# Patient Record
Sex: Female | Born: 1970
Health system: Southern US, Community
[De-identification: ages and names within clinical notes are randomized; demographics above are authoritative.]

## PROBLEM LIST (undated history)

## (undated) DIAGNOSIS — D649 Anemia, unspecified: Secondary | ICD-10-CM

## (undated) DIAGNOSIS — E079 Disorder of thyroid, unspecified: Secondary | ICD-10-CM

## (undated) HISTORY — DX: Disorder of thyroid, unspecified: E07.9

## (undated) HISTORY — DX: Anemia, unspecified: D64.9

---

## 2002-04-09 ENCOUNTER — Inpatient Hospital Stay (HOSPITAL_COMMUNITY): Admission: AD | Admit: 2002-04-09 | Discharge: 2002-04-10 | Payer: Self-pay | Admitting: Obstetrics and Gynecology

## 2004-10-17 ENCOUNTER — Encounter (INDEPENDENT_AMBULATORY_CARE_PROVIDER_SITE_OTHER): Payer: Self-pay | Admitting: *Deleted

## 2004-10-17 ENCOUNTER — Ambulatory Visit (HOSPITAL_COMMUNITY): Admission: AD | Admit: 2004-10-17 | Discharge: 2004-10-17 | Payer: Self-pay | Admitting: Obstetrics and Gynecology

## 2005-01-21 ENCOUNTER — Other Ambulatory Visit: Admission: RE | Admit: 2005-01-21 | Discharge: 2005-01-21 | Payer: Self-pay | Admitting: Obstetrics and Gynecology

## 2005-10-02 ENCOUNTER — Ambulatory Visit: Payer: Self-pay | Admitting: Infectious Diseases

## 2005-10-02 ENCOUNTER — Inpatient Hospital Stay (HOSPITAL_COMMUNITY): Admission: EM | Admit: 2005-10-02 | Discharge: 2005-10-07 | Payer: Self-pay | Admitting: Emergency Medicine

## 2005-12-16 ENCOUNTER — Encounter: Admission: RE | Admit: 2005-12-16 | Discharge: 2005-12-16 | Payer: Self-pay | Admitting: Family Medicine

## 2005-12-25 ENCOUNTER — Other Ambulatory Visit: Admission: RE | Admit: 2005-12-25 | Discharge: 2005-12-25 | Payer: Self-pay | Admitting: Family Medicine

## 2008-09-18 ENCOUNTER — Observation Stay (HOSPITAL_COMMUNITY): Admission: RE | Admit: 2008-09-18 | Discharge: 2008-09-18 | Payer: Self-pay | Admitting: Obstetrics and Gynecology

## 2008-09-30 ENCOUNTER — Inpatient Hospital Stay (HOSPITAL_COMMUNITY): Admission: AD | Admit: 2008-09-30 | Discharge: 2008-10-02 | Payer: Self-pay | Admitting: Obstetrics and Gynecology

## 2011-01-10 ENCOUNTER — Encounter: Payer: Self-pay | Admitting: Obstetrics and Gynecology

## 2011-05-05 NOTE — H&P (Signed)
Allison [name], Allison [name]                     ACCOUNT NO.:  [id number]   MEDICAL RECORD NO.:  [id number]          PATIENT TYPE:  INP   LOCATION:  9165                          FACILITY:  [facility]   PHYSICIAN:  Janine Limbo, M.D.DATE OF BIRTH:  15-Aug-1971   DATE OF ADMISSION:  09/30/2008  DATE OF DISCHARGE:                              HISTORY & PHYSICAL   HISTORY OF PRESENT ILLNESS:  Ms. Villalon is a 40 year old female, gravida 3,  para 1-0-1-1, who presents at 38 weeks and 4 days.  Her EDC is October 10, 2008.  The patient has been followed at the [facility] and Gynecology Division of [facility].  This pregnancy has been complicated by the fact that she is greater than  51 years of age.  She had a normal first trimester screen and a normal  alpha-fetoprotein screen.  She declined amniocentesis.  Her pregnancy  has been largely uncomplicated.  The patient was noted to be in a  transverse presentation.  External version was successful, and the  patient is now vertex.   OBSTETRICAL HISTORY:  In 2003, the patient had a vaginal delivery of a 6  pound 2 ounce female infant at [redacted] weeks gestation.  In 2005, the patient  had a first-trimester miscarriage.  A dilatation and curettage followed.   DRUG ALLERGIES:  No known drug allergies, Betadine allergies, or latex  allergies.   SOCIAL HISTORY:  The patient denies cigarette use, alcohol use, and  recreational drug use.   REVIEW OF SYSTEMS:  Normal pregnancy complaints.   FAMILY HISTORY:  Noncontributory.   PHYSICAL EXAMINATION:  VITAL SIGNS:  The patient's weight is 142 pounds.  The patient is afebrile.  Her vital signs are stable.  HEENT:  Within normal limits except for the patient is uncomfortable  when she has her contractions.  CHEST:  Clear.  HEART:  Regular rate and rhythm.  ABDOMEN:  Gravid.  EXTREMITIES:  Grossly normal.  NEUROLOGIC:  Grossly normal.  PELVIC:  The cervix is 5 cm dilated, 75%  effaced, and -1 in station.   LABORATORY VALUES:  Blood type is AB positive.  Antibody screen is  negative.  VDRL nonreactive.  HBsAg negative.  HIV nonreactive.  Cystic  fibrosis negative.  Third trimester beta-strep is negative.  Glucola  screen negative.  First-trimester screen within normal limits.  Alpha-  fetoprotein screen within normal limits.  Rubella is equivocal.   HOSPITAL COURSE:  The patient was admitted to Labor and Delivery.  Her  membranes were ruptured, and the amniotic fluid was noted to be clear.  The patient was noted to have contractions about every 8 minutes.  They  were mild-to-moderate in intensity prior to rupture of membranes.  The  patient reports that she had irregular contractions at home.  She denies  ruptured membranes and vaginal bleeding.   Her laboratory values at admission include a hemoglobin of 12.7,  hematocrit of 38.2%, white blood cell count of 8900, and platelet count  of 139,000.  The patient was noted to have  thrombocytopenia during the  pregnancy, but her values have always been acceptable.   ASSESSMENT:  1. A 38-1/2 weeks' gestation.  2. Early labor.  3. Age greater than 35 (normal first-trimester screen, the patient      declined amniocentesis).  4. Thrombocytopenia with a stable platelet count at admission.   PLAN:  We will plan on a vaginal delivery.      Janine Limbo, M.D.  Electronically Signed     AVS/MEDQ  D:  09/30/2008  T:  09/30/2008  Job:  [id number]

## 2011-05-08 NOTE — Discharge Summary (Signed)
Allison [name], [name]                     ACCOUNT NO.:  [id number]   MEDICAL RECORD NO.:  [id number]          PATIENT TYPE:  INP   LOCATION:  1606                         FACILITY:  [facility]   PHYSICIAN:  Theone Stanley, MD   DATE OF BIRTH:  09-19-1971   DATE OF ADMISSION:  10/02/2005  DATE OF DISCHARGE:  10/07/2005                                 DISCHARGE SUMMARY   ADMISSION DIAGNOSES:  1.  Nausea and vomiting.  2.  Back pain.   DISCHARGE DIAGNOSES:  1.  Pyelonephritis.  2.  Mild splenomegaly.  3.  Iron-deficiency anemia.   CONSULTATIONS:  Dr. Ninetta Lights with infectious disease.   PROCEDURES/DIAGNOSTIC TESTS:  Patient had an ultrasound of the kidneys  performed on the 16th which showed unremarkable renal ultrasound without  evidence of hydronephrosis or abscess.  Mild splenomegaly.   PERTINENT LABS:  Last white count was 4, hemoglobin 12, hematocrit 36,  platelets 182.  Sodium 140, potassium 3.8, chloride 109, CO2 27, glucose  115, BUN 2, creatinine 0.8.  Iron studies:  Iron less than 10.  UIBC 240.  Ferritin 57.  HCG quantitative urine was negative.  EBV IgG was positive;  however, EBV IgM was negative.  Monospot was negative.  No organism was  grown out of her cultures, including blood and urine.   HOSPITAL COURSE:  [name] is a very pleasant, non-English speaking [place]  female who presents to urgent care on the 12th with fever, chills, malaise,  left flank pain.  It was noted that she had a urinary tract infection.  She  was given oral Levaquin; however, she was unable to keep any of the  antibiotics down.  Because of nausea and vomiting, she presented to the ER  with the same symptoms and a temperature of 102.9.  Patient was started on  Zosyn on her admission.  The patient's pain improved on a daily basis;  however, she continued to spike fevers.  Because of this, infectious disease  was contacted, especially after she had mild splenomegaly.  Workup included  EBV, which was  negative for acute infection.  Mono was negative.  AFB was  negative.  Her fever had resolved by the time of her discharge.  Because of  this, she was discharged on oral antibiotics on the 18th in stable  condition.   DISCHARGE MEDICATIONS:  1.  Augmentin 875 mg 1 p.o. b.i.d. for an additional 7 days.  2.  Lortab 5/500 mg 1 p.o. q.6h. p.r.n.  3.  Iron sulfate 325 mg 1 p.o. b.i.d.  4.  Folic acid 1 mg 1 p.o. daily.  5.  She was instructed to get vitamin C 250 mg 1 p.o. daily over-the-counter      medication.  6.  Instructed to continue on her birth control medication.   FOLLOW UP:  Patient does not have a primary care physician.  I was able to  get her an appointment on October 25 with Dr. Blossom Hoops at [facility]      Theone Stanley, MD  Electronically Signed  AEJ/MEDQ  D:  10/07/2005  T:  10/07/2005  Job:  [id number]   cc:   Leanne Chang, M.D.  Fax: [phone]

## 2011-05-08 NOTE — H&P (Signed)
[facility]  Patient:    Allison [name], Allison [name] Visit Number: [id number] MRN: [id number]          Service Type: OBS Location: [facility] Attending Physician:  Esmeralda Arthur Dictated by:   Saverio Danker, C.N.M. Admit Date:  04/09/2002                           History and Physical  HISTORY OF PRESENT ILLNESS:   Allison [name] is a 40 year old married Asian female, gravida 1, para 0, at [redacted] weeks gestation by ultrasound, who presents complaining of uterine contractions every 2-3 minutes since about 10 p.m.  She now notes a leak of fluid and to feel "that the baby is coming."  She denies any nausea, vomiting, headaches or visual disturbances.  Her pregnancy has been followed at [facility] by the certified nurse midwife service and has been essentially uncomplicated except for positive group B strep and a significant language barrier.  She also has a history of decreased platelets at her first visit in our office.  OBSTETRICAL/GYNECOLOGICAL HISTORY:                      She is a primigravida with an LMP of August 06, 2001, giving her an [facility] of May 25, that was corrected to May 23, by ultrasound.  She has no other GYN history.  ALLERGIES:                    She denies any drug allergies.  PAST MEDICAL HISTORY:         She is unsure of whether she has had any childhood diseases.  She denies any medical problems whatsoever with herself or her family.  GENETIC HISTORY:              She denies any genetic history.  SOCIAL HISTORY:               She is married to [employer].  They are of the Catholic faith.  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 They deny any illicit drug use, alcohol or smoking with this pregnancy.  PRENATAL LABORATORIES:        Her blood type is AB positive.  Her antibody screen is negative.  Syphilis is nonreactive.  Rubella is immune.  Hepatitis B surface antigen is negative.  GC and Chlamydia are both negative.  Pap  was within normal limits.  Her one-hour Glucola was within normal limits.  Her group B strep was positive.  PHYSICAL EXAMINATION:  VITAL SIGNS:                  Her vital signs are stable, she is afebrile.  HEENT:                        Grossly within normal limits.  HEART:                        Regular rhythm and rate.  CHEST:                        Clear.  BREASTS:                      Soft and nontender.  ABDOMEN:  Gravid with uterine contractions every 2-3 minutes.  Her fetal heart rate is reactive and reassuring.  GENITAL:                      Her pelvic exam is 5 cm, 100%, vertex -1 to 0 with spontaneous rupture of the membranes with thin meconium-stained fluid noted.  EXTREMITIES:                  Within normal limits.  ASSESSMENT:                   1. Intrauterine pregnancy at term.                               2. Active labor.                               3. Thin meconium-stained fluid.                               4. Positive group B Streptococcus.  PLAN:                         To admit to labor and delivery, to follow routine CNM orders, to notify the M.D. on call of patients admission, to give her penicillin for group B strep, and to prepare for meconium-stained fluid at delivery. Dictated by:   Vance Gather Duplantis, C.N.M. Attending Physician:  Esmeralda Arthur DD:  04/09/02 TD:  04/09/02 Job: [id number]

## 2011-05-08 NOTE — H&P (Signed)
NAMEKINDRED, HEYING                     ACCOUNT NO.:  [id number]   MEDICAL RECORD NO.:  [id number]          PATIENT TYPE:  EMS   LOCATION:  ED                           FACILITY:  [facility]   PHYSICIAN:  Sherin Quarry, MD      DATE OF BIRTH:  09-05-1971   DATE OF ADMISSION:  10/02/2005  DATE OF DISCHARGE:                                HISTORY & PHYSICAL   Allison [name] is a 40 year old lady who does not speak [place]. History is  obtained from her father who is fairly fluent in [place]. Allison [name] is  generally in excellent health. She does not normally take any medications  except for birth control injections. She has a 69-year-old daughter who is in  good health and had a normal vaginal delivery. Yesterday the patient had an  episode of fever, chills and malaise. This resolved but then today while she  was a work she had another episode of fever, chills, malaise and left flank  pain. She says that she has had intermittent dysuria ever since this illness  began. She was taken to an Urgent Care where a urinalysis was obtained and  pyuria was identified. A urinary tract infection was diagnosed and she was  given oral Levaquin. Unfortunately she has been unable to take the Levaquin  because of nausea and vomiting. Her father became concerned about and  brought her into the [facility] emergency room tonight. On arrival, her  temperature is 102.9, blood pressure 113/54, pulse was 112, O2 saturation  99%. Laboratory studies obtained included a negative urine pregnancy test.  The urinalysis revealed large leukocyte esterase and evidence of gross  pyuria. The white count was 11,000, potassium was 3.1. The patient is  admitted at this time for treatment of what is presumed to be pyelonephritis  with intolerance of oral therapy.   PAST MEDICAL HISTORY:  She has no known drug allergies. She receives the  birth control injection otherwise she does not normally take any medicines  regularly. Father believes that  she has had no operations, no serious  illnesses.   FAMILY HISTORY:  Noncontributory.   SOCIAL HISTORY:  The patient to works in [place]. If I understand  correctly, she does something with socks and footwear. She does not abuse  alcohol and denies cigarette smoking. There is no history of drug abuse.   REVIEW OF SYSTEMS:  Review of systems is difficult to obtain. HEAD:  She  denies headache. CHEST:  She denies coughing wheezing or chest congestion.  CARDIOVASCULAR:  She denies orthopnea, PND or ankle edema. GI: She has had  intermittent vomiting. She describes dull bilateral suprapubic aching. She  has some throbbing and aching in her left flank area. GU: See above. There  is been no hematuria. NEURO:  There is no history of seizure or stroke.  ENDOCRINE:  Denies excessive thirst, urinary frequency or nocturia.   PHYSICAL EXAM:  VITAL SIGNS:  Her temperature is 102.9, blood pressure  113/54, pulse is 112, respirations 20, O2 saturations 99%.  HEENT: Within normal limits.  CHEST:  Clear.  BACK:  Examination the back reveals the left flank tenderness to fist  percussion.  CARDIOVASCULAR:  Reveals normal S1 and S2. The heart rate is about 110.  There are no rubs or gallops. The abdomen is benign. There is no tenderness,  no rebound, no guarding.  NEUROLOGIC/EXTREMITIES:  Neurologic testing and examination of extremities  is normal.   IMPRESSION:  1.  Probable pyelonephritis on the basis of presentation, flank pain and      pyuria.  2.  Intolerance of p.o. therapy. The patient will need intravenous treatment      because she is unable to tolerate oral therapy secondary to nausea and      vomiting.  3.  Dehydration and mild hypokalemia.   PLAN:  The patient be admitted for IV antibiotics and IV fluid. She will be  given treatment for nausea on a p.r.n. basis. Hopefully she can be  discharged as soon as she is able to tolerate p.o. therapy when she is less  toxic.            ______________________________  Sherin Quarry, MD     SY/MEDQ  D:  10/02/2005  T:  10/03/2005  Job:  [id number]

## 2011-05-08 NOTE — Op Note (Signed)
[name], Allison [name]                     ACCOUNT NO.:  [id number]   MEDICAL RECORD NO.:  [id number]          PATIENT TYPE:  MAT   LOCATION:  [facility]                          [facility]  [facility]   PHYSICIAN:  Hal Morales, M.D.DATE OF BIRTH:  Jul 10, 1971   DATE OF PROCEDURE:  10/17/2004  DATE OF DISCHARGE:                                 OPERATIVE REPORT   PREOPERATIVE DIAGNOSES:  Incomplete spontaneous abortion.   POSTOPERATIVE DIAGNOSES:  Incomplete spontaneous abortion.   OPERATION:  Suction dilatation and evacuation.   ANESTHESIA:  Monitored anesthesia care and local.   ESTIMATED BLOOD LOSS:  Less than 100 mL.   COMPLICATIONS:  None.   FINDINGS:  A large amount of products of conception were obtained at the  time of D&E.   BLOOD TYPE:  AB positive.   DESCRIPTION OF PROCEDURE:  The patient was taken to the operating room after  appropriate identification and with an interpreter by her side for the  entire procedure. She was placed on the operating table and after the  placement of the equipment for monitored anesthesia care was placed in the  lithotomy position. The perineum and vagina were prepped with multiple  layers of Betadine and a red Robinson catheter used to empty the bladder.  The perineum was draped as a sterile field. A Grave's speculum was placed in  the posterior vagina and a single tooth tenaculum placed on the anterior  cervix. The cervix was noted to be dilated enough to accommodate a #10  suction curette. This was used to completely suction evacuate all products  of conception from the uterus. The uterine cavity was then curetted with a  sharp curette with the assessment that all products of conception had been  removed. Hemostasis was noted to be adequate. The single tooth tenaculum and  all instruments were removed from the vagina and the patient taken from the  operating room to the recovery room in satisfactory condition having  tolerated the procedure well  with sponge and instrument counts correct.  Prior to placement of the suction catheter, a paracervical block was  achieved with a total of 10 mL of 2% Xylocaine in the 5 and 7 o'clock  position. After the procedure but before moving the patient, she was given  0.2 mg of Methergine IM.     [name]   VPH/MEDQ  D:  10/17/2004  T:  10/18/2004  Job:  [id number]

## 2011-09-21 LAB — CBC
HCT: 37.6
HCT: 36.1
Hemoglobin: 12.5
Hemoglobin: 11.8 — ABNORMAL LOW
MCHC: 33.1
MCV: 86.1
Platelets: 121 — ABNORMAL LOW
RBC: 4.37
RBC: 4.47
RDW: 14.1
WBC: 7.2
WBC: 11.6 — ABNORMAL HIGH
WBC: 8.9

## 2011-09-21 LAB — RPR: RPR Ser Ql: NONREACTIVE

## 2012-05-23 ENCOUNTER — Encounter: Payer: Self-pay | Admitting: Obstetrics and Gynecology

## 2012-05-23 ENCOUNTER — Ambulatory Visit (INDEPENDENT_AMBULATORY_CARE_PROVIDER_SITE_OTHER): Payer: BC Managed Care – PPO | Admitting: Obstetrics and Gynecology

## 2012-05-23 VITALS — BP 102/62 | Ht 62.0 in | Wt 123.0 lb

## 2012-05-23 DIAGNOSIS — Z124 Encounter for screening for malignant neoplasm of cervix: Secondary | ICD-10-CM

## 2012-05-23 DIAGNOSIS — Z309 Encounter for contraceptive management, unspecified: Secondary | ICD-10-CM

## 2012-05-23 DIAGNOSIS — N898 Other specified noninflammatory disorders of vagina: Secondary | ICD-10-CM

## 2012-05-23 LAB — POCT WET PREP (WET MOUNT): Clue Cells Wet Prep Whiff POC: NEGATIVE

## 2012-05-23 NOTE — Progress Notes (Signed)
The patient reports:no complaints  Contraception:no method  Last mammogram: approximate date 2012 and was normal Last pap: approximate date 06/2011 and was normal  GC/Chlamydia cultures offered: requested HIV/RPR/HbsAg offered:  declined HSV 1 and 2 glycoprotein offered: declined  Menstrual cycle regular and monthly: Yes Menstrual flow normal: Yes  Urinary symptoms: none Normal bowel movements: Yes Reports abuse at home: No:   No complaints  Filed Vitals:   05/23/12 1055  BP: 102/62  ROS: noncontributory  Physical Examination: General appearance - alert, well appearing, and in no distress Neck - supple, no significant adenopathy Chest - clear to auscultation, no wheezes, rales or rhonchi, symmetric air entry Heart - normal rate and regular rhythm Abdomen - soft, nontender, nondistended, no masses or organomegaly Breasts - breasts appear normal, no suspicious masses, no skin or nipple changes or axillary nodes Pelvic - normal external genitalia, vulva, vagina, cervix, uterus and adnexa Back exam - no CVAT Extremities - no edema, redness or tenderness in the calves or thighs  Results for orders placed in visit on 05/23/12  POCT WET PREP (WET MOUNT)      Component Value Range   Source Wet Prep POC vaginal     WBC, Wet Prep HPF POC       Bacteria Wet Prep HPF POC none     BACTERIA WET PREP MORPHOLOGY POC       Clue Cells Wet Prep HPF POC None     CLUE CELLS WET PREP WHIFF POC Negative Whiff     Yeast Wet Prep HPF POC None     KOH Wet Prep POC       Trichomonas Wet Prep HPF POC none     pH 4.5     A/P Wet prep-neg RTO for AEX Pap today

## 2012-05-25 LAB — PAP IG, CT-NG, RFX HPV ASCU: Chlamydia Probe Amp: NEGATIVE

## 2013-09-02 ENCOUNTER — Ambulatory Visit (INDEPENDENT_AMBULATORY_CARE_PROVIDER_SITE_OTHER): Payer: BC Managed Care – PPO | Admitting: Family Medicine

## 2013-09-02 VITALS — BP 102/76 | HR 60 | Temp 98.7°F | Resp 16 | Ht 60.5 in | Wt 125.8 lb

## 2013-09-02 DIAGNOSIS — R6889 Other general symptoms and signs: Secondary | ICD-10-CM

## 2013-09-02 DIAGNOSIS — H01003 Unspecified blepharitis right eye, unspecified eyelid: Secondary | ICD-10-CM

## 2013-09-02 DIAGNOSIS — H01009 Unspecified blepharitis unspecified eye, unspecified eyelid: Secondary | ICD-10-CM

## 2013-09-02 DIAGNOSIS — H579 Unspecified disorder of eye and adnexa: Secondary | ICD-10-CM

## 2013-09-02 MED ORDER — AZITHROMYCIN 1 % OP SOLN: OPHTHALMIC | Status: DC

## 2013-09-02 NOTE — Patient Instructions (Addendum)
Blepharitis  Blepharitis is a skin problem that makes your eyelids watery, red, puffy (swollen), crusty, scaly, or painful. It may also make your eyes itch. You may lose eyelashes.  HOME CARE   Keep your hands clean.   Use a clean towel each time you dry your eyelids. Do not share towels or makeup with anyone.   Carefully wash your eyelids and eyelashes 2 times a day. Use warm water and baby shampoo or just water.   Wash your face and eyebrows at least once a day.   Hold a folded washcloth under warm water. Squeeze the water out. Put the warm washcloth on your eyes 2 times a day for 10 minutes, or as told by your doctor.   Apply medicated cream as told by your doctor.   Avoid rubbing your eyes.   Avoid wearing makeup until you get better.   Follow up with your doctor as told.  GET HELP RIGHT AWAY IF:   Your pain, redness, or puffiness gets worse.   Your pain, redness, or puffiness spreads to other parts of your face.   Your vision changes, or you have pain when looking at lights or moving objects.   You have a fever.   You do not get better after 2 to 4 days.  MAKE SURE YOU:   Understand these instructions.   Will watch your condition.   Will get help right away if you are not doing well or get worse.  Document Released: 09/15/2008 Document Revised: 02/29/2012 Document Reviewed: 01/14/2011  ExitCare Patient Information 2014 [employer].

## 2013-09-02 NOTE — Progress Notes (Signed)
[facility]:  Office Visit  Chief Complaint:  Chief Complaint  Patient presents with  . Cellulitis    Stye ? Eyes - Both x 3-4 dys    HPI: Allison [name] is a 42 y.o. female who complains of  1 week history of bilateral lower eye lid swelling and itchiness, started on left eye. She has scratched it. She has used otc cooling eye drops. She denies HA, she denies vision changes photosensitivity or pain. No fevers or chills. Just + swollen eyes with clear dc/  Past Medical History  Diagnosis Date  . Thyroid disease   . Anemia    History reviewed. No pertinent past surgical history. History   Social History  . Marital Status: Married    Spouse Name: N/A    Number of Children: N/A  . Years of Education: N/A   Social History Main Topics  . Smoking status: Never Smoker   . Smokeless tobacco: Never Used  . Alcohol Use: No  . Drug Use: No  . Sexual Activity: Yes    Birth Control/ Protection: None   Other Topics Concern  . None   Social History Narrative  . None   Family History  Problem Relation Age of Onset  . High blood pressure Sister    No Known Allergies Prior to Admission medications   Medication Sig Start Date End Date Taking? Authorizing Provider  ferrous sulfate 325 (65 FE) MG tablet Take 325 mg by mouth daily with breakfast.   Yes Historical Provider, MD  levothyroxine (SYNTHROID, LEVOTHROID) 25 MCG tablet Take 25 mcg by mouth daily before breakfast.   Yes Historical Provider, MD     ROS: The patient denies fevers, chills, night sweats, unintentional weight loss, chest pain, palpitations, wheezing, dyspnea on exertion, nausea, vomiting, abdominal pain, dysuria, hematuria, melena, numbness, weakness, or tingling.   All other systems have been reviewed and were otherwise negative with the exception of those mentioned in the HPI and as above.    PHYSICAL EXAM: Filed Vitals:   09/02/13 1440  BP: 102/76  Pulse: 60  Temp: 98.7 F (37.1 C)  Resp:  16   Filed Vitals:   09/02/13 1440  Height: 5' 0.5" (1.537 m)  Weight: 125 lb 12.8 oz (57.063 kg)   Body mass index is 24.15 kg/(m^2).  General: Alert, no acute distress HEENT:  Normocephalic, atraumatic, oropharynx patent. EOMI, PERRLA. + lower lid swelling, she does not appear to have a stye at this time but she has been rubbing and scratching her eyes and the hair follicles of lower lids look irritated. + Conjunctivitis.  Cardiovascular:  Regular rate and rhythm, no rubs murmurs or gallops.  No Carotid bruits, radial pulse intact. No pedal edema.  Respiratory: Clear to auscultation bilaterally.  No wheezes, rales, or rhonchi.  No cyanosis, no use of accessory musculature GI: No organomegaly, abdomen is soft and non-tender, positive bowel sounds.  No masses. Skin: No rashes. Neurologic: Facial musculature symmetric. Psychiatric: Patient is appropriate throughout our interaction. Lymphatic: No cervical lymphadenopathy Musculoskeletal: Gait intact.   LABS: Results for orders placed in visit on 05/23/12  POCT WET PREP (WET MOUNT)      Result Value Range   Source Wet Prep POC vaginal     WBC, Wet Prep HPF POC       Bacteria Wet Prep HPF POC none     BACTERIA WET PREP MORPHOLOGY POC       Clue Cells Wet Prep HPF POC None  CLUE CELLS WET PREP WHIFF POC Negative Whiff     Yeast Wet Prep HPF POC None     KOH Wet Prep POC       Trichomonas Wet Prep HPF POC none     pH 4.5    PAP IG, CT-NG, RFX HPV ASCU      Result Value Range   Specimen adequacy:       FINAL DIAGNOSIS:       COMMENTS:       Cytotechnologist:       Chlamydia Probe Amp NEGATIVE     GC Probe Amp NEGATIVE       EKG/XRAY:   Primary read interpreted by Dr. Conley Rolls at [facility].   ASSESSMENT/PLAN: Encounter Diagnoses  Name Primary?  . Blepharitis of both eyes Yes  . Itchy eyes    Rx Azithromycin 2 drops BID x 1 day, then  1 drop in each eyes daily x 6 days Warm compresses, if no improvement then will try  erythromycin ointment for better penetration Gross sideeffects, risk and benefits, and alternatives of medications d/w patient. Patient is aware that all medications have potential sideeffects and we are unable to predict every sideeffect or drug-drug interaction that may occur.  Hamilton Capri PHUONG, DO 09/02/2013 4:06 PM

## 2015-03-05 ENCOUNTER — Encounter (HOSPITAL_COMMUNITY): Payer: Self-pay | Admitting: *Deleted

## 2015-03-05 ENCOUNTER — Inpatient Hospital Stay (HOSPITAL_COMMUNITY)
Admission: AD | Admit: 2015-03-05 | Discharge: 2015-03-05 | DRG: 812 | Disposition: A | Discharge status: Home / Self Care | Payer: BLUE CROSS/BLUE SHIELD | Source: Ambulatory Visit | Attending: Obstetrics & Gynecology | Admitting: Obstetrics & Gynecology

## 2015-03-05 DIAGNOSIS — N946 Dysmenorrhea, unspecified: Secondary | ICD-10-CM | POA: Diagnosis present

## 2015-03-05 DIAGNOSIS — E039 Hypothyroidism, unspecified: Secondary | ICD-10-CM | POA: Diagnosis present

## 2015-03-05 DIAGNOSIS — D649 Anemia, unspecified: Principal | ICD-10-CM | POA: Diagnosis present

## 2015-03-05 DIAGNOSIS — Z79899 Other long term (current) drug therapy: Secondary | ICD-10-CM

## 2015-03-05 DIAGNOSIS — N920 Excessive and frequent menstruation with regular cycle: Secondary | ICD-10-CM | POA: Diagnosis present

## 2015-03-05 LAB — CBC
HCT: 25.3 % — ABNORMAL LOW (ref 36.0–46.0)
HEMOGLOBIN: 6.9 g/dL — AB (ref 12.0–15.0)
MCH: 17.2 pg — AB (ref 26.0–34.0)
MCHC: 27.3 g/dL — AB (ref 30.0–36.0)
MCV: 63.1 fL — AB (ref 78.0–100.0)
PLATELETS: 157 10*3/uL (ref 150–400)
RBC: 4.01 MIL/uL (ref 3.87–5.11)
RDW: 21.3 % — ABNORMAL HIGH (ref 11.5–15.5)
WBC: 5.6 10*3/uL (ref 4.0–10.5)

## 2015-03-05 LAB — HEMOGLOBIN AND HEMATOCRIT, BLOOD
HCT: 32.3 % — ABNORMAL LOW (ref 36.0–46.0)
Hemoglobin: 9.5 g/dL — ABNORMAL LOW (ref 12.0–15.0)

## 2015-03-05 LAB — PREPARE RBC (CROSSMATCH)

## 2015-03-05 LAB — ABO/RH: ABO/RH(D): AB POS

## 2015-03-05 MED ORDER — SODIUM CHLORIDE 0.9 % IV SOLN
Freq: Once | INTRAVENOUS | Status: AC
  Administered 2015-03-05: 12:00:00 via INTRAVENOUS

## 2015-03-05 MED ORDER — LEVOTHYROXINE SODIUM 25 MCG PO TABS
25.0000 ug | ORAL_TABLET | Freq: Every day | ORAL | Status: DC
  Administered 2015-03-05: 25 ug via ORAL
  Filled 2015-03-05 (×2): qty 1

## 2015-03-05 MED ORDER — LEVOTHYROXINE SODIUM 25 MCG PO TABS
25.0000 ug | ORAL_TABLET | Freq: Every day | ORAL | Status: DC
Start: 2015-03-06 — End: 2015-03-05

## 2015-03-05 MED ORDER — FERROUS SULFATE 325 (65 FE) MG PO TABS: 325.0000 mg | ORAL_TABLET | Freq: Every day | ORAL | Status: DC

## 2015-03-05 MED ORDER — FERROUS SULFATE 325 (65 FE) MG PO TABS
325.0000 mg | ORAL_TABLET | Freq: Every day | ORAL | Status: DC
  Administered 2015-03-05: 325 mg via ORAL
  Filled 2015-03-05: qty 1

## 2015-03-05 MED ORDER — DIPHENHYDRAMINE HCL 50 MG/ML IJ SOLN
25.0000 mg | Freq: Once | INTRAMUSCULAR | Status: AC
  Administered 2015-03-05: 25 mg via INTRAVENOUS
  Filled 2015-03-05: qty 1

## 2015-03-05 NOTE — Progress Notes (Signed)
CRITICAL VALUE ALERT  Critical value received:  Hgb=6.9/Hct=25.3/PLT=157  Date of notification:  03/05/15   Time of notification:  11:05  Critical value read back:Yes.    Nurse who received alert:  Jose Persiaarol Teaghan Formica, RN   MD notified (1st page):  Gerrit HeckJessica Emly, CNM  Time of first page:  11:10 (direct phone call)  MD notified (2nd page): none  Time of second page: none  Responding MD:  Gerrit HeckJessica Emly, CNM   Time MD responded:  11:10 Continue with current orders for blood transfusion

## 2015-03-05 NOTE — Discharge Instructions (Signed)
Levonorgestrel intrauterine device (IUD) What is this medicine? LEVONORGESTREL IUD (LEE voe nor jes trel) is a contraceptive (birth control) device. The device is placed inside the uterus by a healthcare professional. It is used to prevent pregnancy and can also be used to treat heavy bleeding that occurs during your period. Depending on the device, it can be used for 3 to 5 years. This medicine may be used for other purposes; ask your health care provider or pharmacist if you have questions. COMMON BRAND NAME(S): Verda Cumins What should I tell my health care provider before I take this medicine? They need to know if you have any of these conditions: -abnormal Pap smear -cancer of the breast, uterus, or cervix -diabetes -endometritis -genital or pelvic infection now or in the past -have more than one sexual partner or your partner has more than one partner -heart disease -history of an ectopic or tubal pregnancy -immune system problems -IUD in place -liver disease or tumor -problems with blood clots or take blood-thinners -use intravenous drugs -uterus of unusual shape -vaginal bleeding that has not been explained -an unusual or allergic reaction to levonorgestrel, other hormones, silicone, or polyethylene, medicines, foods, dyes, or preservatives -pregnant or trying to get pregnant -breast-feeding How should I use this medicine? This device is placed inside the uterus by a health care professional. Talk to your pediatrician regarding the use of this medicine in children. Special care may be needed. Overdosage: If you think you have taken too much of this medicine contact a poison control center or emergency room at once. NOTE: This medicine is only for you. Do not share this medicine with others. What if I miss a dose? This does not apply. What may interact with this medicine? Do not take this medicine with any of the following  medications: -amprenavir -bosentan -fosamprenavir This medicine may also interact with the following medications: -aprepitant -barbiturate medicines for inducing sleep or treating seizures -bexarotene -griseofulvin -medicines to treat seizures like carbamazepine, ethotoin, felbamate, oxcarbazepine, phenytoin, topiramate -modafinil -pioglitazone -rifabutin -rifampin -rifapentine -some medicines to treat HIV infection like atazanavir, indinavir, lopinavir, nelfinavir, tipranavir, ritonavir -St. John's wort -warfarin This list may not describe all possible interactions. Give your health care provider a list of all the medicines, herbs, non-prescription drugs, or dietary supplements you use. Also tell them if you smoke, drink alcohol, or use illegal drugs. Some items may interact with your medicine. What should I watch for while using this medicine? Visit your doctor or health care professional for regular check ups. See your doctor if you or your partner has sexual contact with others, becomes HIV positive, or gets a sexual transmitted disease. This product does not protect you against HIV infection (AIDS) or other sexually transmitted diseases. You can check the placement of the IUD yourself by reaching up to the top of your vagina with clean fingers to feel the threads. Do not pull on the threads. It is a good habit to check placement after each menstrual period. Call your doctor right away if you feel more of the IUD than just the threads or if you cannot feel the threads at all. The IUD may come out by itself. You may become pregnant if the device comes out. If you notice that the IUD has come out use a backup birth control method like condoms and call your health care provider. Using tampons will not change the position of the IUD and are okay to use during your period. What side effects may  I notice from receiving this medicine? Side effects that you should report to your doctor or  health care professional as soon as possible: -allergic reactions like skin rash, itching or hives, swelling of the face, lips, or tongue -fever, flu-like symptoms -genital sores -high blood pressure -no menstrual period for 6 weeks during use -pain, swelling, warmth in the leg -pelvic pain or tenderness -severe or sudden headache -signs of pregnancy -stomach cramping -sudden shortness of breath -trouble with balance, talking, or walking -unusual vaginal bleeding, discharge -yellowing of the eyes or skin Side effects that usually do not require medical attention (report to your doctor or health care professional if they continue or are bothersome): -acne -breast pain -change in sex drive or performance -changes in weight -cramping, dizziness, or faintness while the device is being inserted -headache -irregular menstrual bleeding within first 3 to 6 months of use -nausea This list may not describe all possible side effects. Call your doctor for medical advice about side effects. You may report side effects to FDA at 1-800-FDA-1088. Where should I keep my medicine? This does not apply. NOTE: This sheet is a summary. It may not cover all possible information. If you have questions about this medicine, talk to your doctor, pharmacist, or health care provider.  2015, Elsevier/Gold Standard. (2012-01-07 13:54:04) Iron-Rich Diet An iron-rich diet contains foods that are good sources of iron. Iron is an important mineral that helps your body produce hemoglobin. Hemoglobin is a protein in red blood cells that carries oxygen to the body's tissues. Sometimes, the iron level in your blood can be low. This may be caused by:  A lack of iron in your diet.  Blood loss.  Times of growth, such as during pregnancy or during a child's growth and development. Low levels of iron can cause a decrease in the number of red blood cells. This can result in iron deficiency anemia. Iron deficiency anemia  symptoms include:  Tiredness.  Weakness.  Irritability.  Increased chance of infection. Here are some recommendations for daily iron intake:  Males older than 44 years of age need 8 mg of iron per day.  Women ages 8019 to 2150 need 18 mg of iron per day.  Pregnant women need 27 mg of iron per day, and women who are over 44 years of age and breastfeeding need 9 mg of iron per day.  Women over the age of 44 need 8 mg of iron per day. SOURCES OF IRON There are 2 types of iron that are found in food: heme iron and nonheme iron. Heme iron is absorbed by the body better than nonheme iron. Heme iron is found in meat, poultry, and fish. Nonheme iron is found in grains, beans, and vegetables. Heme Iron Sources Food / Iron (mg)  Chicken liver, 3 oz (85 g)/ 10 mg  Beef liver, 3 oz (85 g)/ 5.5 mg  Oysters, 3 oz (85 g)/ 8 mg  Beef, 3 oz (85 g)/ 2 to 3 mg  Shrimp, 3 oz (85 g)/ 2.8 mg  [place], 3 oz (85 g)/ 2 mg  Chicken, 3 oz (85 g) / 1 mg  Fish (tuna, halibut), 3 oz (85 g)/ 1 mg  Pork, 3 oz (85 g)/ 0.9 mg Nonheme Iron Sources Food / Iron (mg)  Ready-to-eat breakfast cereal, iron-fortified / 3.9 to 7 mg  Tofu,  cup / 3.4 mg  Kidney beans,  cup / 2.6 mg  Baked potato with skin / 2.7 mg  Asparagus,  cup /  2.2 mg  Avocado / 2 mg  Dried peaches,  cup / 1.6 mg  Raisins,  cup / 1.5 mg  Soy milk, 1 cup / 1.5 mg  Whole-wheat bread, 1 slice / 1.2 mg  Spinach, 1 cup / 0.8 mg  Broccoli,  cup / 0.6 mg IRON ABSORPTION Certain foods can decrease the body's absorption of iron. Try to avoid these foods and beverages while eating meals with iron-containing foods:  Coffee.  Tea.  Fiber.  Soy. Foods containing vitamin C can help increase the amount of iron your body absorbs from iron sources, especially from nonheme sources. Eat foods with vitamin C along with iron-containing foods to increase your iron absorption. Foods that are high in vitamin C include many fruits and  vegetables. Some good sources are:  Fresh orange juice.  Oranges.  Strawberries.  Mangoes.  Grapefruit.  Red bell peppers.  Green bell peppers.  Broccoli.  Potatoes with skin.  Tomato juice. Document Released: 07/21/2005 Document Revised: 02/29/2012 Document Reviewed: 05/28/2011 [facility] Patient Information 2015 [place], [place]. This information is not intended to replace advice given to you by your health care provider. Make sure you discuss any questions you have with your health care provider.

## 2015-03-05 NOTE — H&P (Signed)
Allison [name] is an 44 y.o. female. Patient presents for blood transfusion d/t anemia possibly due to menorrhagia.   Pertinent Gynecological History: Menses: flow is moderate and with severe dysmenorrhea Bleeding: None at current Contraception: none -Desires Mirena Blood transfusions: To be received Sexually transmitted diseases: no past history Previous GYN Procedures: None  Last mammogram: Unknown Last pap: normal Date: 02/28/2015 OB History: G2, P2002   Menstrual History: Menarche age: Unknown  No LMP recorded.    Past Medical History  Diagnosis Date  . Thyroid disease   . Anemia     No past surgical history on file.  Family History  Problem Relation Age of Onset  . High blood pressure Sister     Social History:  reports that she has never smoked. She has never used smokeless tobacco. She reports that she does not drink alcohol or use illicit drugs.  Allergies: No Known Allergies  Prescriptions prior to admission  Medication Sig Dispense Refill Last Dose  . azithromycin (AZASITE) 1 % ophthalmic solution Use 1 drop in each eye daily  for 2 days, then 1 drop in each eye daily for 5 days. Then stop. 2.5 mL 0   . ferrous sulfate 325 (65 FE) MG tablet Take 325 mg by mouth daily with breakfast.   Taking  . levothyroxine (SYNTHROID, LEVOTHROID) 25 MCG tablet Take 25 mcg by mouth daily before breakfast.   Taking    ROS  There were no vitals taken for this visit. Physical Exam  Constitutional: She is oriented to person, place, and time. She appears well-developed and well-nourished. No distress.  HENT:  Head: Normocephalic and atraumatic.  Respiratory: Effort normal.  Musculoskeletal: Normal range of motion.  Neurological: She is alert and oriented to person, place, and time.    No results found for this or any previous visit (from the past 24 hour(s)).  No results found.  Assessment/Plan: Asymptomatic Anemia  Admit to [facility] per Rx by Dr[id number] [place] prior to transfusion H&H 4 hours s/p transfusion Reassess for discharge this evening  Sarabi Sockwell LYNN MSN, CNM 03/05/2015, 9:51 AM

## 2015-03-05 NOTE — Discharge Summary (Signed)
Physician Discharge Summary  Patient ID: Allison [name] MRN: [id number] DOB/AGE: 44-Nov-1972 44 y.o.  Admit date: 03/05/2015 Discharge date: 03/05/2015  Admission Diagnoses: Anemia  Discharge Diagnoses:  Active Problems:   Anemia   Discharged Condition: stable  Hospital Course: Blood Transfusion, Labs  Consults: None  Significant Diagnostic Studies: labs:  Results for orders placed or performed during the hospital encounter of 03/05/15 (from the past 24 hour(s))  Prepare RBC     Status: None   Collection Time: 03/05/15 10:14 AM  Result Value Ref Range   Order Confirmation ORDER PROCESSED BY BLOOD BANK   CBC     Status: Abnormal   Collection Time: 03/05/15 10:45 AM  Result Value Ref Range   WBC 5.6 4.0 - 10.5 K/uL   RBC 4.01 3.87 - 5.11 MIL/uL   Hemoglobin 6.9 (LL) 12.0 - 15.0 g/dL   HCT 34.7 (L) 42.5 - 95.6 %   MCV 63.1 (L) 78.0 - 100.0 fL   MCH 17.2 (L) 26.0 - 34.0 pg   MCHC 27.3 (L) 30.0 - 36.0 g/dL   RDW 38.7 (H) 56.4 - 33.2 %   Platelets 157 150 - 400 K/uL  Type and screen for Red Blood Exchange     Status: None (Preliminary result)   Collection Time: 03/05/15 10:45 AM  Result Value Ref Range   ABO/RH(D) AB POS    Antibody Screen NEG    Sample Expiration 03/08/2015    Unit Number [id number]    Blood Component Type RED CELLS,LR    Unit division 00    Status of Unit ISSUED    Transfusion Status OK TO TRANSFUSE    Crossmatch Result Compatible    Unit Number [id number]    Blood Component Type RED CELLS,LR    Unit division 00    Status of Unit ISSUED    Transfusion Status OK TO TRANSFUSE    Crossmatch Result Compatible   ABO/Rh     Status: None   Collection Time: 03/05/15 10:45 AM  Result Value Ref Range   ABO/RH(D) AB POS   Hemoglobin and hematocrit, blood     Status: Abnormal   Collection Time: 03/05/15 10:06 PM  Result Value Ref Range   Hemoglobin 9.5 (L) 12.0 - 15.0 g/dL   HCT 22.0 (L) 25.4 - 27.0 %     Treatments: Blood  transfusion-2Units  Discharge Exam: Blood pressure 107/70, pulse 91, temperature 98.2 F (36.8 C), temperature source Oral, resp. rate 18, height 5' (1.524 m), weight 131 lb (59.421 kg), SpO2 100 %. General appearance: alert, cooperative and no distress Head: Normocephalic, without obvious abnormality, atraumatic Resp: clear to auscultation bilaterally Cardio: regular rate and rhythm GI: normal findings: bowel sounds normal and soft, non-tender Extremities: extremities normal, atraumatic, no cyanosis or edema Pulses: 2+ and symmetric Skin: Warm and dry  Disposition:   Discharge Instructions    Diet - low sodium heart healthy    Complete by:  As directed      Discharge instructions    Complete by:  As directed   Continue medications as prescribed.  Follow up in office as scheduled.     Increase activity slowly    Complete by:  As directed             Medication List    TAKE these medications        docusate sodium 100 MG capsule  Commonly known as:  COLACE  Take 100 mg by mouth 2 (two) times daily. Takes with Iron supplement  ergocalciferol 50000 UNITS capsule  Commonly known as:  VITAMIN D2  Take 50,000 Units by mouth once a week. On Saturdays     ferrous sulfate 325 (65 FE) MG tablet  Take 325 mg by mouth 2 (two) times daily with a meal.     folic acid 1 MG tablet  Commonly known as:  FOLVITE  Take 1 mg by mouth daily.     levothyroxine 25 MCG tablet  Commonly known as:  SYNTHROID, LEVOTHROID  Take 25 mcg by mouth daily before breakfast.           Follow-up Information    [facility] On 03/07/2015.   Specialty:  Obstetrics and Gynecology   Why:  Please call if you have any questions or concerns, prior to your next visit.   Contact information:   [address] [place] [id number] [phone]      Signed: Marlene BastMLY, Neville Pauls LYNN MSN, CNM 03/05/2015, 10:47 PM

## 2015-03-06 LAB — TYPE AND SCREEN
ABO/RH(D): AB POS
ANTIBODY SCREEN: NEGATIVE
UNIT DIVISION: 0
UNIT DIVISION: 0

## 2015-03-07 ENCOUNTER — Other Ambulatory Visit: Payer: Self-pay | Admitting: Obstetrics and Gynecology

## 2016-04-13 DIAGNOSIS — Z30430 Encounter for insertion of intrauterine contraceptive device: Secondary | ICD-10-CM | POA: Diagnosis not present

## 2016-05-06 DIAGNOSIS — Z975 Presence of (intrauterine) contraceptive device: Secondary | ICD-10-CM | POA: Diagnosis not present

## 2016-05-06 DIAGNOSIS — Z30431 Encounter for routine checking of intrauterine contraceptive device: Secondary | ICD-10-CM | POA: Diagnosis not present

## 2016-09-26 ENCOUNTER — Ambulatory Visit (INDEPENDENT_AMBULATORY_CARE_PROVIDER_SITE_OTHER): Payer: BLUE CROSS/BLUE SHIELD | Admitting: Family Medicine

## 2016-09-26 VITALS — BP 122/72 | HR 73 | Temp 98.3°F | Resp 17 | Ht 60.5 in | Wt 143.0 lb

## 2016-09-26 DIAGNOSIS — E039 Hypothyroidism, unspecified: Secondary | ICD-10-CM

## 2016-09-26 DIAGNOSIS — D649 Anemia, unspecified: Secondary | ICD-10-CM | POA: Diagnosis not present

## 2016-09-26 LAB — BASIC METABOLIC PANEL
BUN: 9 mg/dL (ref 7–25)
CHLORIDE: 104 mmol/L (ref 98–110)
CO2: 26 mmol/L (ref 20–31)
Calcium: 9.3 mg/dL (ref 8.6–10.2)
Creat: 0.76 mg/dL (ref 0.50–1.10)
GLUCOSE: 82 mg/dL (ref 65–99)
Potassium: 4.3 mmol/L (ref 3.5–5.3)
SODIUM: 139 mmol/L (ref 135–146)

## 2016-09-26 LAB — CBC
HCT: 41.1 % (ref 35.0–45.0)
HEMOGLOBIN: 13.3 g/dL (ref 11.7–15.5)
MCH: 25 pg — ABNORMAL LOW (ref 27.0–33.0)
MCHC: 32.4 g/dL (ref 32.0–36.0)
MCV: 77.1 fL — AB (ref 80.0–100.0)
PLATELETS: 166 10*3/uL (ref 140–400)
RBC: 5.33 MIL/uL — ABNORMAL HIGH (ref 3.80–5.10)
RDW: 15.1 % — ABNORMAL HIGH (ref 11.0–15.0)
WBC: 5 10*3/uL (ref 3.8–10.8)

## 2016-09-26 MED ORDER — LEVOTHYROXINE SODIUM 25 MCG PO TABS: 25.0000 ug | ORAL_TABLET | Freq: Every day | ORAL | 0 refills | Status: DC

## 2016-09-26 NOTE — Progress Notes (Signed)
  [place] interpreter via phone used for the entire visit:  Allison [name] is a [age] y.o. female who presents to [facility] today for medication refills  1.  Synthroid refill:  Has prior diagnosis of hypothyroidism.  Has been taking synthroid for years.  Hasn't had TSH checked recently.  No heat/cold intolerance.  No LE edema.   She has history of anemia in past treated with iron by mouth.  She is not currently taking this only vitamin D.      No other past medical history.  No surgeries.  Denies any family history of hypothyroidism or other chronic medical issues.    ROS as above.  Pertinently, no chest pain, palpitations, SOB, Fever, Chills, Abd pain, N/V/D.   PMH reviewed. Patient is a nonsmoker.   Past Medical History:  Diagnosis Date  . Anemia   . Thyroid disease    No past surgical history on file.  Medications reviewed. Current Outpatient Prescriptions  Medication Sig Dispense Refill  . ergocalciferol (VITAMIN D2) 50000 UNITS capsule Take 50,000 Units by mouth once a week. On Saturdays    . levothyroxine (SYNTHROID, LEVOTHROID) 25 MCG tablet Take 25 mcg by mouth daily before breakfast.    . docusate sodium (COLACE) 100 MG capsule Take 100 mg by mouth 2 (two) times daily. Takes with Iron supplement    . ferrous sulfate 325 (65 FE) MG tablet Take 325 mg by mouth 2 (two) times daily with a meal.     . folic acid (FOLVITE) 1 MG tablet Take 1 mg by mouth daily.     No current facility-administered medications for this visit.      Physical Exam:  BP 122/72 (BP Location: Right Arm, Patient Position: Sitting, Cuff Size: Normal)   Pulse 73   Temp 98.3 F (36.8 C) (Oral)   Resp 17   Ht 5' 0.5" (1.537 m)   Wt 143 lb (64.9 kg)   SpO2 100%   BMI 27.47 kg/m  Gen:  Alert, cooperative patient who appears stated age in no acute distress.  Vital signs reviewed. HEENT: EOMI,  MMM Neck:  No goiter noted.  Pulm:  Clear to auscultation bilaterally   Cardiac:  Regular rate  and rhythm  Exts: Non edematous BL  LE, warm and well perfused.   Assessment and Plan:  1.  Hypothyroidism": - refill for today. - rechecking TSH.  May need to make adjustments to dosage based on results. -We'll let patient's know results. Follow-up in 6 months or sooner if there are abnormalities in TSH.  2.  Anemia:   - not taking anything for this.  Likely secondary to #1 above. -Checking CBC today.

## 2016-09-26 NOTE — Patient Instructions (Addendum)
It was good to meet you today  We are checking your thyroid and blood levels today.  We will let you know these results.  I have sent your medicines in for you already.  Take this one pill a day.    IF you received an x-ray today, you will receive an invoice from [facility]. Please contact [facility] at [phone] with questions or concerns regarding your invoice.   IF you received labwork today, you will receive an invoice from [facility]. Please contact [facility] at [phone] with questions or concerns regarding your invoice.   Our billing staff will not be able to assist you with questions regarding bills from these companies.  You will be contacted with the lab results as soon as they are available. The fastest way to get your results is to activate your My Chart account. Instructions are located on the last page of this paperwork. If you have not heard from us regarding the results in 2 weeks, please contact this office.

## 2016-09-27 LAB — TSH: TSH: 4.07 mIU/L

## 2016-09-29 ENCOUNTER — Encounter: Payer: Self-pay | Admitting: Family Medicine

## 2016-10-23 ENCOUNTER — Other Ambulatory Visit: Payer: Self-pay | Admitting: Family Medicine

## 2016-11-28 ENCOUNTER — Other Ambulatory Visit: Payer: Self-pay | Admitting: Family Medicine

## 2016-12-28 ENCOUNTER — Other Ambulatory Visit: Payer: Self-pay | Admitting: Family Medicine

## 2017-03-17 DIAGNOSIS — Z1231 Encounter for screening mammogram for malignant neoplasm of breast: Secondary | ICD-10-CM | POA: Diagnosis not present

## 2017-03-17 DIAGNOSIS — R899 Unspecified abnormal finding in specimens from other organs, systems and tissues: Secondary | ICD-10-CM | POA: Diagnosis not present

## 2017-03-17 DIAGNOSIS — E559 Vitamin D deficiency, unspecified: Secondary | ICD-10-CM | POA: Diagnosis not present

## 2017-03-17 DIAGNOSIS — E039 Hypothyroidism, unspecified: Secondary | ICD-10-CM | POA: Diagnosis not present

## 2017-03-17 DIAGNOSIS — Z01419 Encounter for gynecological examination (general) (routine) without abnormal findings: Secondary | ICD-10-CM | POA: Diagnosis not present

## 2017-03-17 DIAGNOSIS — R7309 Other abnormal glucose: Secondary | ICD-10-CM | POA: Diagnosis not present

## 2017-03-17 DIAGNOSIS — Z124 Encounter for screening for malignant neoplasm of cervix: Secondary | ICD-10-CM | POA: Diagnosis not present

## 2017-05-25 ENCOUNTER — Ambulatory Visit (INDEPENDENT_AMBULATORY_CARE_PROVIDER_SITE_OTHER): Payer: BLUE CROSS/BLUE SHIELD | Admitting: Family Medicine

## 2017-05-25 ENCOUNTER — Encounter: Payer: Self-pay | Admitting: Family Medicine

## 2017-05-25 VITALS — BP 125/79 | HR 62 | Temp 98.2°F | Resp 18 | Ht 60.51 in | Wt 142.8 lb

## 2017-05-25 DIAGNOSIS — E559 Vitamin D deficiency, unspecified: Secondary | ICD-10-CM

## 2017-05-25 DIAGNOSIS — Z131 Encounter for screening for diabetes mellitus: Secondary | ICD-10-CM

## 2017-05-25 DIAGNOSIS — Z0000 Encounter for general adult medical examination without abnormal findings: Secondary | ICD-10-CM

## 2017-05-25 NOTE — Patient Instructions (Signed)
     IF you received an x-ray today, you will receive an invoice from [facility]. Please contact [facility] at [phone] with questions or concerns regarding your invoice.   IF you received labwork today, you will receive an invoice from [facility]. Please contact [facility] at [phone] with questions or concerns regarding your invoice.   Our billing staff will not be able to assist you with questions regarding bills from these companies.  You will be contacted with the lab results as soon as they are available. The fastest way to get your results is to activate your My Chart account. Instructions are located on the last page of this paperwork. If you have not heard from us regarding the results in 2 weeks, please contact this office.     

## 2017-05-25 NOTE — Progress Notes (Signed)
Chief Complaint  Patient presents with  . Annual Exam    CPE- no PAP    Subjective:  Allison [name] is a 46 y.o. female here for a health maintenance visit.  Patient is established pt   Patient went to see Dr. Su Hilt and had a pap smear and breast exam  She also had labs done Her CBC and lipids were checked  Hypothyroidism She is taking levothyroxine daily She will need a refill She denies weight changes, appetite changes, energy fluctuations Lab Results  Component Value Date   TSH 4.07 09/26/2016     Patient Active Problem List   Diagnosis Date Noted  . Anemia 03/05/2015    Past Medical History:  Diagnosis Date  . Anemia   . Thyroid disease     History reviewed. No pertinent surgical history.   Outpatient Medications Prior to Visit  Medication Sig Dispense Refill  . docusate sodium (COLACE) 100 MG capsule Take 100 mg by mouth 2 (two) times daily. Takes with Iron supplement    . ergocalciferol (VITAMIN D2) 50000 UNITS capsule Take 50,000 Units by mouth once a week. On Saturdays    . ferrous sulfate 325 (65 FE) MG tablet Take 325 mg by mouth 2 (two) times daily with a meal.     . folic acid (FOLVITE) 1 MG tablet Take 1 mg by mouth daily.    Marland Kitchen levothyroxine (SYNTHROID, LEVOTHROID) 25 MCG tablet TAKE 1 TABLET BY MOUTH EVERY DAY BEFORE BREAKFAST 30 tablet 6   No facility-administered medications prior to visit.     No Known Allergies   Family History  Problem Relation Age of Onset  . High blood pressure Sister      Health Habits: Dental Exam: up to date Eye Exam: up to date Exercise: 5 times/week on average at work Current exercise activities: walking Diet: balanced  Social History   Social History  . Marital status: Married    Spouse name: N/A  . Number of children: N/A  . Years of education: N/A   Occupational History  . Not on file.   Social History Main Topics  . Smoking status: Never Smoker  . Smokeless tobacco: Never Used  . Alcohol use No   . Drug use: No  . Sexual activity: Yes    Birth control/ protection: None   Other Topics Concern  . Not on file   Social History Narrative  . No narrative on file   History  Alcohol Use No   History  Smoking Status  . Never Smoker  Smokeless Tobacco  . Never Used   History  Drug Use No    GYN: Sexual Health Menstrual status: regular menses LMP: Patient's last menstrual period was 05/19/2017 (exact date). Last pap smear: see HM section History of abnormal pap smears:  Sexually active: with female partner Current contraception: Mirena  Health Maintenance: See under health Maintenance activity for review of completion dates as well.  There is no immunization history on file for this patient.    Depression Screen-PHQ2/9 Depression screen [facility] 2/9 05/25/2017 09/26/2016  Decreased Interest 0 0  Down, Depressed, Hopeless 0 0  PHQ - 2 Score 0 0       Depression Severity and Treatment Recommendations:  0-4= None  5-9= Mild / Treatment: Support, educate to call if worse; return in one month  10-14= Moderate / Treatment: Support, watchful waiting; Antidepressant or Psycotherapy  15-19= Moderately severe / Treatment: Antidepressant OR Psychotherapy  >= 20 = Major depression, severe /  Antidepressant AND Psychotherapy    Review of Systems   Review of Systems  Constitutional: Negative for chills, fever, malaise/fatigue and weight loss.  Respiratory: Negative for cough, shortness of breath and wheezing.   Cardiovascular: Negative for chest pain and palpitations.  Gastrointestinal: Negative for abdominal pain, diarrhea, nausea and vomiting.  Genitourinary: Negative for dysuria, frequency and urgency.  Neurological: Negative for dizziness, tingling and headaches.  Psychiatric/Behavioral: Negative for depression. The patient is not nervous/anxious and does not have insomnia.     See HPI for ROS as well.    Objective:   Vitals:   05/25/17 0844  BP: 125/79  Pulse:  62  Resp: 18  Temp: 98.2 F (36.8 C)  TempSrc: Oral  SpO2: 100%  Weight: 142 lb 12.8 oz (64.8 kg)  Height: 5' 0.51" (1.537 m)    Body mass index is 27.42 kg/m.  Physical Exam  Constitutional: She is oriented to person, place, and time.  Eyes: Conjunctivae and EOM are normal.  Neck: Normal range of motion. Neck supple.  Cardiovascular: Normal rate, regular rhythm and normal heart sounds.   Pulmonary/Chest: Effort normal and breath sounds normal. No respiratory distress. She has no wheezes.  Abdominal: Soft. Bowel sounds are normal. She exhibits no distension. There is no tenderness.  Neurological: She is alert and oriented to person, place, and time. She has normal reflexes.  Skin: Skin is warm. No erythema.  Psychiatric: She has a normal mood and affect. Her behavior is normal. Judgment and thought content normal.    CBC  hgb 13.4 MCV 80.8  Lipid TC 187 HDL 42 TG 102 LDL 125   Assessment/Plan:   Patient was seen for a health maintenance exam.  Counseled the patient on health maintenance issues. Reviewed her health mainteance schedule and ordered appropriate tests (see orders.) Counseled on regular exercise and weight management. Recommend regular eye exams and dental cleaning.   The following issues were addressed today for health maintenance:   Axel was seen today for annual exam.  Diagnoses and all orders for this visit:  Encounter for health maintenance examination in adult -     Comprehensive metabolic panel -     TSH  Vitamin D deficiency- currently on supplements -     VITAMIN D 25 Hydroxy (Vit-D Deficiency, Fractures)  Screening for diabetes mellitus- based on age and family history  -     Hemoglobin A1c     No Follow-up on file.    Body mass index is 27.42 kg/m.:  Discussed the patient's BMI with patient. The BMI body mass index is 27.42 kg/m.     No future appointments.  Patient Instructions       IF you received an x-ray today,  you will receive an invoice from [facility]. Please contact [facility] at [phone] with questions or concerns regarding your invoice.   IF you received labwork today, you will receive an invoice from [facility]. Please contact [facility] at [phone] with questions or concerns regarding your invoice.   Our billing staff will not be able to assist you with questions regarding bills from these companies.  You will be contacted with the lab results as soon as they are available. The fastest way to get your results is to activate your My Chart account. Instructions are located on the last page of this paperwork. If you have not heard from us regarding the results in 2 weeks, please contact this office.

## 2017-05-26 ENCOUNTER — Encounter: Payer: Self-pay | Admitting: Family Medicine

## 2017-05-26 LAB — HEMOGLOBIN A1C
Est. average glucose Bld gHb Est-mCnc: 105 mg/dL
Hgb A1c MFr Bld: 5.3 % (ref 4.8–5.6)

## 2017-05-26 LAB — COMPREHENSIVE METABOLIC PANEL
ALT: 10 IU/L (ref 0–32)
AST: 17 IU/L (ref 0–40)
Albumin/Globulin Ratio: 1.4 (ref 1.2–2.2)
Albumin: 4.2 g/dL (ref 3.5–5.5)
Alkaline Phosphatase: 42 IU/L (ref 39–117)
BUN / CREAT RATIO: 11 (ref 9–23)
BUN: 8 mg/dL (ref 6–24)
Bilirubin Total: 0.3 mg/dL (ref 0.0–1.2)
CHLORIDE: 102 mmol/L (ref 96–106)
CO2: 25 mmol/L (ref 18–29)
CREATININE: 0.73 mg/dL (ref 0.57–1.00)
Calcium: 9.2 mg/dL (ref 8.7–10.2)
GFR, EST AFRICAN AMERICAN: 115 mL/min/{1.73_m2} (ref >59)
GFR, EST NON AFRICAN AMERICAN: 100 mL/min/{1.73_m2} (ref >59)
Globulin, Total: 3.1 g/dL (ref 1.5–4.5)
Glucose: 80 mg/dL (ref 65–99)
POTASSIUM: 4 mmol/L (ref 3.5–5.2)
Sodium: 140 mmol/L (ref 134–144)
Total Protein: 7.3 g/dL (ref 6.0–8.5)

## 2017-05-26 LAB — VITAMIN D 25 HYDROXY (VIT D DEFICIENCY, FRACTURES): Vit D, 25-Hydroxy: 32.1 ng/mL (ref 30.0–100.0)

## 2017-05-26 LAB — TSH: TSH: 4.26 u[IU]/mL (ref 0.450–4.500)

## 2017-05-26 MED ORDER — LEVOTHYROXINE SODIUM 25 MCG PO TABS: ORAL_TABLET | ORAL | 6 refills | Status: DC

## 2017-05-26 NOTE — Addendum Note (Signed)
Addended by: Collie SiadSTALLINGS, Kilian Schwartz A on: 05/26/2017 06:53 AM   Modules accepted: Orders

## 2017-07-09 ENCOUNTER — Other Ambulatory Visit: Payer: Self-pay | Admitting: Family Medicine

## 2017-07-30 ENCOUNTER — Other Ambulatory Visit: Payer: Self-pay | Admitting: Family Medicine

## 2018-04-07 ENCOUNTER — Other Ambulatory Visit: Payer: Self-pay | Admitting: Obstetrics and Gynecology

## 2018-04-07 DIAGNOSIS — E559 Vitamin D deficiency, unspecified: Secondary | ICD-10-CM | POA: Diagnosis not present

## 2018-04-07 DIAGNOSIS — N841 Polyp of cervix uteri: Secondary | ICD-10-CM | POA: Diagnosis not present

## 2018-04-07 DIAGNOSIS — Z862 Personal history of diseases of the blood and blood-forming organs and certain disorders involving the immune mechanism: Secondary | ICD-10-CM | POA: Diagnosis not present

## 2018-04-07 DIAGNOSIS — Z01419 Encounter for gynecological examination (general) (routine) without abnormal findings: Secondary | ICD-10-CM | POA: Diagnosis not present

## 2018-04-07 DIAGNOSIS — Z124 Encounter for screening for malignant neoplasm of cervix: Secondary | ICD-10-CM | POA: Diagnosis not present

## 2018-04-15 DIAGNOSIS — Z1231 Encounter for screening mammogram for malignant neoplasm of breast: Secondary | ICD-10-CM | POA: Diagnosis not present

## 2018-07-21 ENCOUNTER — Other Ambulatory Visit: Payer: Self-pay | Admitting: Family Medicine

## 2018-07-21 NOTE — Telephone Encounter (Signed)
Refill request for levothyroxine 25 mcg #15 with 0 refills approved.  Pt needs ov for further refills.  Will send to schedulers to call pt and schedule annual pe/med refills with stallings. Dgaddy, CMA

## 2018-08-07 ENCOUNTER — Other Ambulatory Visit: Payer: Self-pay | Admitting: Family Medicine

## 2018-08-08 NOTE — Telephone Encounter (Signed)
Levothyroxine refill Last Refill:07/21/18 # 15; no refills  Last OV: 05/25/17 PCP: Dr. Creta LevinStallings  Pharmacy:[facility] [address] / [place]  Medication refill refused due to overdue for office visit; called pt., spoke with Father-in-Law (on DPR)  Advised of need for office appt. to continue to receive refills; appt. scheduled for 08/13/18 @ 11:20 AM with PCP; agreed with plan.  No refill given at this time.

## 2018-08-13 ENCOUNTER — Encounter: Payer: Self-pay | Admitting: Family Medicine

## 2018-08-13 ENCOUNTER — Ambulatory Visit (INDEPENDENT_AMBULATORY_CARE_PROVIDER_SITE_OTHER): Payer: BLUE CROSS/BLUE SHIELD | Admitting: Family Medicine

## 2018-08-13 VITALS — BP 113/77 | HR 78 | Temp 97.8°F | Resp 16 | Ht 60.75 in | Wt 143.4 lb

## 2018-08-13 DIAGNOSIS — Z23 Encounter for immunization: Secondary | ICD-10-CM | POA: Diagnosis not present

## 2018-08-13 DIAGNOSIS — E039 Hypothyroidism, unspecified: Secondary | ICD-10-CM | POA: Diagnosis not present

## 2018-08-13 DIAGNOSIS — Z0000 Encounter for general adult medical examination without abnormal findings: Secondary | ICD-10-CM | POA: Diagnosis not present

## 2018-08-13 DIAGNOSIS — Z13220 Encounter for screening for lipoid disorders: Secondary | ICD-10-CM | POA: Diagnosis not present

## 2018-08-13 DIAGNOSIS — Z131 Encounter for screening for diabetes mellitus: Secondary | ICD-10-CM | POA: Diagnosis not present

## 2018-08-13 DIAGNOSIS — D649 Anemia, unspecified: Secondary | ICD-10-CM | POA: Diagnosis not present

## 2018-08-13 MED ORDER — LEVOTHYROXINE SODIUM 25 MCG PO TABS: ORAL_TABLET | ORAL | 0 refills | Status: DC

## 2018-08-13 NOTE — Patient Instructions (Addendum)
If you have lab work done today you will be contacted with your lab results within the next 2 weeks.  If you have not heard from us then please contact us. The fastest way to get your results is to register for My Chart.   IF you received an x-ray today, you will receive an invoice from [facility]. Please contact [facility] at [phone] with questions or concerns regarding your invoice.   IF you received labwork today, you will receive an invoice from [facility]. Please contact [facility] at [phone] with questions or concerns regarding your invoice.   Our billing staff will not be able to assist you with questions regarding bills from these companies.  You will be contacted with the lab results as soon as they are available. The fastest way to get your results is to activate your My Chart account. Instructions are located on the last page of this paperwork. If you have not heard from us regarding the results in 2 weeks, please contact this office.      Nh???c gia?p/suy gip Hypothyroidism Nh??c gip l m?t r?i lo?n ? tuy?n gip. Tuy?n gip l m?t tuy?n l?n n?m ? ph?n c? d??i pha tr??c c?a qu v?. Tuy?n gip gi?i phng cc hc mn ki?m sot cch c? th? lm vi?c. V?i nh??c gip, tuy?n gip khng t?o ra ?? nh?ng hc mn ny. Nguyn nhn g gy ra? Nguyn nhn nh??c gip c th? bao g?m:  Nhi?m vi rt.  [place].  H? th?ng phng v? (h? mi?n d?ch) c?a qu v? t?n cng tuy?n gip.  M?t s? lo?i thu?c nh?t ??nh.  D? t?t b?m sinh.  Tr??c ?y ? x? tr? ? ??u ho?c c?.  Tr??c ?y ? ?i?u tr? b?ng i ?t phng x?.  Tr??c ?y ? ph?u thu?t c?t b? m?t ph?n ho?c ton b? tuy?n gip.  Cc v?n ?? v?i tuy?n n?m ? gi?a no qu v? (tuy?n yn).  Cc d?u hi?u ho?c tri?u ch?ng l g? Cc d?u hi?u v tri?u ch?ng c?a nh??c gip c th? bao g?m:  C?m th?y nh? l qu v? khng c n?ng l??ng (l? ph?).  Khng th? ch?u ???c l?nh.  T?ng cn khng th? gi?i thch b?ng s? thay  ??i ch? ?? ?n ho?c thi quen t?p th? d?c.  Da kh.  Tc x?.  Kinh nguy?t khng ??u.  Suy ngh? ch?m ch?p.  To bn.  Bu?n b ho?c tr?m c?m.  Ch?n ?on tnh tr?ng ny nh? th? no? Chuyn gia ch?m [place] s?c kh?e c th? ch?n ?on nh??c gip b?ng xt nghi?m mu v xt nghi?m siu m. Tnh tr?ng ny ???c ?i?u tr? nh? th? no? Nh??c gip ???c ?i?u tr? b?ng thu?c thay th? cc hc mn m c? th? khng t?o ra. Sau khi qu v? b?t ??u ?i?u tr?, c th? m?t nhi?u tu?n ?? cc tri?u ch?ng kh?i h?t. Tun th? nh?ng h??ng d?n ny ? nh:  Ch? s? d?ng thu?c theo ch? d?n c?a chuyn gia ch?m [place] s?c kh?e.  N?u qu v? b?t ??u dng b?t c? lo?i thu?c m?i no, hy ni v?i chuyn gia ch?m [place] s?c kh?e.  Tun th? t?t c? cc l?n khm theo di theo ch? d?n c?a chuyn gia ch?m [place] s?c kh?e. ?i?u ny c vai tr quan tr?ng. Khi tnh tr?ng c?a qu v? c?i thi?n, nhu c?u v? li?u c?a qu v? c th? thay ??i. Qu v? s? c?n lm xt nghi?m mu ??nh k? ??  chuyn gia ch?m [place] s?c kh?e c th? theo di tnh tr?ng c?a qu v?. Hy lin l?c v?i chuyn gia ch?m [place] s?c kh?e n?u:  Cc tri?u ch?ng c?a qu v? khng ?? h?n sau khi ???c ?i?u tr?[place] Heads v? ?ang dng thu?c thay th? hc mn tuy?n gip v: ? Qu v? ?? m? hi qu nhi?u. ? Qu v? b? run r?y. ? Qu v? c?m th?y lo u. ? Qu v? gi?m cn nhanh. ? Qu v? khng th? ch?u ???c nng. ? Qu v? b? dao ??ng c?m xc. ? Qu v? b? tiu ch?y. ? Qu v? c?m th?y y?u ?t. Yu c?u tr? gip ngay l?p t?c n?u:  Qu v? b? ?au ng?c.  Qu v? c nh?p tim khng ??u.  Qu v? c nh?p tim hanh. Thng tin ny khng nh?m m?c ?ch thay th? cho l?i khuyn m chuyn gia ch?m [place] s?c kh?e ni v?i qu v?. Hy b?o ??m qu v? ph?i th?o lu?n b?t k? v?n ?? g m qu v? c v?i chuyn gia ch?m [place] s?c kh?e c?a qu v?. Document Released: 12/07/2005 Document Revised: 03/24/2017 Document Reviewed: 04/24/2014 Elsevier Interactive Patient Education  2018 [employer].

## 2018-08-13 NOTE — Progress Notes (Signed)
Chief Complaint  Patient presents with  . Annual Exam  . Medication Refill    Levothyroxine    Subjective:  Allison [name] is a 47 y.o. female here for a health maintenance visit.  Patient is established pt  Patient Active Problem List   Diagnosis Date Noted  . Anemia 03/05/2015    Past Medical History:  Diagnosis Date  . Anemia   . Thyroid disease     No past surgical history on file.   Outpatient Medications Prior to Visit  Medication Sig Dispense Refill  . levonorgestrel (MIRENA) 20 MCG/24HR IUD 1 each by Intrauterine route once.    Marland Kitchen. levothyroxine (SYNTHROID, LEVOTHROID) 25 MCG tablet TAKE 1 TABLET BY MOUTH EVERY DAY BEFORE BREAKFAST 15 tablet 0  . docusate sodium (COLACE) 100 MG capsule Take 100 mg by mouth 2 (two) times daily. Takes with Iron supplement    . ergocalciferol (VITAMIN D2) 50000 UNITS capsule Take 50,000 Units by mouth once a week. On Saturdays    . ferrous sulfate 325 (65 FE) MG tablet Take 325 mg by mouth 2 (two) times daily with a meal.     . folic acid (FOLVITE) 1 MG tablet Take 1 mg by mouth daily.     No facility-administered medications prior to visit.     No Known Allergies   Family History  Problem Relation Age of Onset  . High blood pressure Sister      Health Habits: Dental Exam: up to date Eye Exam: up to date Exercise: 2-3 times/week on average Current exercise activities: walking/running Diet:   Social History   Socioeconomic History  . Marital status: Married    Spouse name: Not on file  . Number of children: Not on file  . Years of education: Not on file  . Highest education level: Not on file  Occupational History  . Not on file  Social Needs  . Financial resource strain: Not on file  . Food insecurity:    Worry: Not on file    Inability: Not on file  . Transportation needs:    Medical: Not on file    Non-medical: Not on file  Tobacco Use  . Smoking status: Never Smoker  . Smokeless tobacco: Never Used  Substance  and Sexual Activity  . Alcohol use: No  . Drug use: No  . Sexual activity: Yes    Birth control/protection: None  Lifestyle  . Physical activity:    Days per week: Not on file    Minutes per session: Not on file  . Stress: Not on file  Relationships  . Social connections:    Talks on phone: Not on file    Gets together: Not on file    Attends religious service: Not on file    Active member of club or organization: Not on file    Attends meetings of clubs or organizations: Not on file    Relationship status: Not on file  . Intimate partner violence:    Fear of current or ex partner: Not on file    Emotionally abused: Not on file    Physically abused: Not on file    Forced sexual activity: Not on file  Other Topics Concern  . Not on file  Social History Narrative  . Not on file   Social History   Substance and Sexual Activity  Alcohol Use No   Social History   Tobacco Use  Smoking Status Never Smoker  Smokeless Tobacco Never Used  Social History   Substance and Sexual Activity  Drug Use No    GYN: Sexual Health Menstrual status: regular menses Last pap smear: see HM section History of abnormal pap smears:  Sexually active: with female partner Current contraception:   Health Maintenance: See under health Maintenance activity for review of completion dates as well. Immunization History  Administered Date(s) Administered  . Tdap 08/13/2018      Depression Screen-PHQ2/9 Depression screen [facility] 2/9 05/25/2017 09/26/2016  Decreased Interest 0 0  Down, Depressed, Hopeless 0 0  PHQ - 2 Score 0 0       Depression Severity and Treatment Recommendations:  0-4= None  5-9= Mild / Treatment: Support, educate to call if worse; return in one month  10-14= Moderate / Treatment: Support, watchful waiting; Antidepressant or Psycotherapy  15-19= Moderately severe / Treatment: Antidepressant OR Psychotherapy  >= 20 = Major depression, severe / Antidepressant AND  Psychotherapy    Review of Systems   Review of Systems  Constitutional: Negative for chills and fever.  Eyes: Negative for blurred vision and double vision.  Respiratory: Negative for cough, shortness of breath and wheezing.   Cardiovascular: Negative for chest pain and palpitations.  Gastrointestinal: Negative for abdominal pain, nausea and vomiting.  Genitourinary: Negative for dysuria, frequency and urgency.  Musculoskeletal: Negative for back pain, myalgias and neck pain.  Skin: Negative for itching and rash.  Neurological: Negative for dizziness, tingling and headaches.  Psychiatric/Behavioral: Negative for depression. The patient is not nervous/anxious.     See HPI for ROS as well.    Objective:   Vitals:   08/13/18 1124  BP: 113/77  Pulse: 78  Resp: 16  Temp: 97.8 F (36.6 C)  TempSrc: Oral  SpO2: 100%  Weight: 143 lb 6.4 oz (65 kg)  Height: 5' 0.75" (1.543 m)   Wt Readings from Last 3 Encounters:  08/13/18 143 lb 6.4 oz (65 kg)  05/25/17 142 lb 12.8 oz (64.8 kg)  09/26/16 143 lb (64.9 kg)    Body mass index is 27.32 kg/m.  Physical Exam  Constitutional: She is oriented to person, place, and time. She appears well-developed and well-nourished.  HENT:  Head: Normocephalic and atraumatic.  Eyes: Conjunctivae and EOM are normal.  Neck: Normal range of motion. Neck supple. No thyromegaly present.  Cardiovascular: Normal rate, regular rhythm and normal heart sounds.  No murmur heard. Pulmonary/Chest: Effort normal and breath sounds normal. No stridor. No respiratory distress.  Abdominal: Soft. Bowel sounds are normal. She exhibits no distension and no mass. There is no tenderness. There is no guarding.  Neurological: She is alert and oriented to person, place, and time.  Skin: Skin is warm. Capillary refill takes less than 2 seconds.  Psychiatric: She has a normal mood and affect. Her behavior is normal. Judgment and thought content normal.         Assessment/Plan:   Patient was seen for a health maintenance exam.  Counseled the patient on health maintenance issues. Reviewed her health mainteance schedule and ordered appropriate tests (see orders.) Counseled on regular exercise and weight management. Recommend regular eye exams and dental cleaning.   The following issues were addressed today for health maintenance:   Allison [name] was seen today for annual exam and medication refill.  Diagnoses and all orders for this visit:  Encounter for health maintenance examination in adult Women's Health Maintenance Plan Advised monthly breast exam and annual mammogram Advised dental exam every six months Discussed stress management Discussed pap smear screening  guidelines    Hypothyroidism, unspecified type -     TSH -     Comprehensive metabolic panel -     Lipid panel  Anemia, unspecified type -     CBC  Screening for diabetes mellitus -     Hemoglobin A1c -     Comprehensive metabolic panel  Screening, lipid -     Lipid panel  Need for Tdap vaccination -     Tdap vaccine greater than or equal to 7yo IM  Other orders -     levothyroxine (SYNTHROID, LEVOTHROID) 25 MCG tablet; TAKE 1 TABLET BY MOUTH EVERY DAY BEFORE BREAKFAST    Return in about 1 year (around 08/14/2019) for physical exam .    Body mass index is 27.32 kg/m.:  Discussed the patient's BMI with patient. The BMI body mass index is 27.32 kg/m.     No future appointments.  Patient Instructions       If you have lab work done today you will be contacted with your lab results within the next 2 weeks.  If you have not heard from [place] then please contact us. The fastest way to get your results is to register for My Chart.   IF you received an x-ray today, you will receive an invoice from [facility]. Please contact [facility] at [phone] with questions or concerns regarding your invoice.   IF you received labwork today, you will  receive an invoice from [place]. Please contact [facility] at [phone] with questions or concerns regarding your invoice.   Our billing staff will not be able to assist you with questions regarding bills from these companies.  You will be contacted with the lab results as soon as they are available. The fastest way to get your results is to activate your My Chart account. Instructions are located on the last page of this paperwork. If you have not heard from [place] regarding the results in 2 weeks, please contact this office.      Nh???c gia?p/suy gip Hypothyroidism Nh??c gip l m?t r?i lo?n ? tuy?n gip. Tuy?n gip l m?t tuy?n l?n n?m ? ph?n c? d??i pha tr??c c?a qu v?. Tuy?n gip gi?i phng cc hc mn ki?m sot cch c? th? lm vi?c. V?i nh??c gip, tuy?n gip khng t?o ra ?? nh?ng hc mn ny. Nguyn nhn g gy ra? Nguyn nhn nh??c gip c th? bao g?m:  Nhi?m vi rt.  [place]ang [place].  H? th?ng phng v? (h? mi?n d?ch) c?a qu v? t?n cng tuy?n gip.  M?t s? lo?i thu?c nh?t ??nh.  D? t?t b?m sinh.  Tr??c ?y ? x? tr? ? ??u ho?c c?.  Tr??c ?y ? ?i?u tr? b?ng i ?t phng x?.  Tr??c ?y ? ph?u thu?t c?t b? m?t ph?n ho?c ton b? tuy?n gip.  Cc v?n ?? v?i tuy?n n?m ? gi?a no qu v? (tuy?n yn).  Cc d?u hi?u ho?c tri?u ch?ng l g? Cc d?u hi?u v tri?u ch?ng c?a nh??c gip c th? bao g?m:  C?m th?y nh? l qu v? khng c n?ng l??ng (l? ph?).  Khng th? ch?u ???c l?nh.  T?ng cn khng th? gi?i thch b?ng s? thay ??i ch? ?? ?n ho?c thi quen t?p th? d?c.  Da kh.  Tc x?.  Kinh nguy?t khng ??u.  Suy ngh? ch?m ch?p.  To bn.  Bu?n b ho?c tr?m c?m.  Ch?n ?on tnh tr?ng ny nh? th? no? Chuyn gia ch?m [place] s?c kh?e c  th? ch?n ?on nh??c gip b?ng xt nghi?m mu v xt nghi?m siu m. Tnh tr?ng ny ???c ?i?u tr? nh? th? no? Nh??c gip ???c ?i?u tr? b?ng thu?c thay th? cc hc mn m c? th? khng t?o ra. Sau khi qu v? b?t ??u ?i?u tr?, c th? m?t nhi?u  tu?n ?? cc tri?u ch?ng kh?i h?t. Tun th? nh?ng h??ng d?n ny ? nh:  Ch? s? d?ng thu?c theo ch? d?n c?a chuyn gia ch?m [place] s?c kh?e.  N?u qu v? b?t ??u dng b?t c? lo?i thu?c m?i no, hy ni v?i chuyn gia ch?m [place] s?c kh?e.  Tun th? t?t c? cc l?n khm theo di theo ch? d?n c?a chuyn gia ch?m [place] s?c kh?e. ?i?u ny c vai tr quan tr?ng. Khi tnh tr?ng c?a qu v? c?i thi?n, nhu c?u v? li?u c?a qu v? c th? thay ??i. Qu v? s? c?n lm xt nghi?m mu ??nh k? ?? chuyn gia ch?m [place] s?c kh?e c th? theo di tnh tr?ng c?a qu v?. Hy lin l?c v?i chuyn gia ch?m [place] s?c kh?e n?u:  Cc tri?u ch?ng c?a qu v? khng ?? h?n sau khi ???c ?i?u tr?[place] Heads v? ?ang dng thu?c thay th? hc mn tuy?n gip v: ? Qu v? ?? m? hi qu nhi?u. ? Qu v? b? run r?y. ? Qu v? c?m th?y lo u. ? Qu v? gi?m cn nhanh. ? Qu v? khng th? ch?u ???c nng. ? Qu v? b? dao ??ng c?m xc. ? Qu v? b? tiu ch?y. ? Qu v? c?m th?y y?u ?t. Yu c?u tr? gip ngay l?p t?c n?u:  Qu v? b? ?au ng?c.  Qu v? c nh?p tim khng ??u.  Qu v? c nh?p tim hanh. Thng tin ny khng nh?m m?c ?ch thay th? cho l?i khuyn m chuyn gia ch?m [place] s?c kh?e ni v?i qu v?. Hy b?o ??m qu v? ph?i th?o lu?n b?t k? v?n ?? g m qu v? c v?i chuyn gia ch?m [place] s?c kh?e c?a qu v?. Document Released: 12/07/2005 Document Revised: 03/24/2017 Document Reviewed: 04/24/2014 Elsevier Interactive Patient Education  2018 [employer].

## 2018-08-14 LAB — LIPID PANEL
Chol/HDL Ratio: 3.7 ratio (ref 0.0–4.4)
Cholesterol, Total: 180 mg/dL (ref 100–199)
HDL: 49 mg/dL (ref >39)
LDL CALC: 114 mg/dL — AB (ref 0–99)
Triglycerides: 84 mg/dL (ref 0–149)
VLDL CHOLESTEROL CAL: 17 mg/dL (ref 5–40)

## 2018-08-14 LAB — CBC
HEMOGLOBIN: 13.4 g/dL (ref 11.1–15.9)
Hematocrit: 42.1 % (ref 34.0–46.6)
MCH: 26.1 pg — AB (ref 26.6–33.0)
MCHC: 31.8 g/dL (ref 31.5–35.7)
MCV: 82 fL (ref 79–97)
Platelets: 164 10*3/uL (ref 150–450)
RBC: 5.13 x10E6/uL (ref 3.77–5.28)
RDW: 13.5 % (ref 12.3–15.4)
WBC: 6.4 10*3/uL (ref 3.4–10.8)

## 2018-08-14 LAB — HEMOGLOBIN A1C
Est. average glucose Bld gHb Est-mCnc: 111 mg/dL
Hgb A1c MFr Bld: 5.5 % (ref 4.8–5.6)

## 2018-08-14 LAB — COMPREHENSIVE METABOLIC PANEL
ALT: 9 IU/L (ref 0–32)
AST: 13 IU/L (ref 0–40)
Albumin/Globulin Ratio: 1.4 (ref 1.2–2.2)
Albumin: 4 g/dL (ref 3.5–5.5)
Alkaline Phosphatase: 45 IU/L (ref 39–117)
BUN/Creatinine Ratio: 11 (ref 9–23)
BUN: 8 mg/dL (ref 6–24)
Bilirubin Total: 0.3 mg/dL (ref 0.0–1.2)
CO2: 24 mmol/L (ref 20–29)
Calcium: 9.2 mg/dL (ref 8.7–10.2)
Chloride: 102 mmol/L (ref 96–106)
Creatinine, Ser: 0.71 mg/dL (ref 0.57–1.00)
GFR calc Af Amer: 118 mL/min/{1.73_m2} (ref >59)
GFR calc non Af Amer: 102 mL/min/{1.73_m2} (ref >59)
Globulin, Total: 2.8 g/dL (ref 1.5–4.5)
Glucose: 69 mg/dL (ref 65–99)
Potassium: 3.9 mmol/L (ref 3.5–5.2)
Sodium: 140 mmol/L (ref 134–144)
Total Protein: 6.8 g/dL (ref 6.0–8.5)

## 2018-10-30 ENCOUNTER — Other Ambulatory Visit: Payer: Self-pay | Admitting: Family Medicine

## 2018-10-31 NOTE — Telephone Encounter (Signed)
Requested medication (s) are due for refill today: yes  Requested medication (s) are on the active medication list: yes  Last refill:  08/13/18  Future visit scheduled: no  Notes to clinic:  TSH overdue, last one was 05/25/17.  Requested Prescriptions  Pending Prescriptions Disp Refills   levothyroxine (SYNTHROID, LEVOTHROID) 25 MCG tablet [Pharmacy Med Name: LEVOTHYROXINE 25 MCG TABLET] 30 tablet 2    Sig: TAKE 1 TABLET BY MOUTH EVERY DAY BEFORE BREAKFAST     Endocrinology:  Hypothyroid Agents Failed - 10/30/2018  1:09 AM      Failed - TSH needs to be rechecked within 3 months after an abnormal result. Refill until TSH is due.      Failed - TSH in normal range and within 360 days    TSH  Date Value Ref Range Status  05/25/2017 4.260 0.450 - 4.500 uIU/mL Final         Passed - Valid encounter within last 12 months    Recent Outpatient Visits          2 months ago Encounter for health maintenance examination in adult   [facility] at [facility], Manus Rudd, MD   1 year ago Encounter for health maintenance examination in adult   [facility] at [facility], [place] A, MD   2 years ago Hypothyroidism, unspecified type   [facility] at [facility], Newt Lukes, MD   5 years ago Blepharitis of both eyes   Primary Care at [facility], Thao P, DO

## 2019-02-20 ENCOUNTER — Other Ambulatory Visit: Payer: Self-pay | Admitting: Family Medicine

## 2019-02-20 NOTE — Telephone Encounter (Signed)
Requested Prescriptions  Pending Prescriptions Disp Refills  . levothyroxine (SYNTHROID, LEVOTHROID) 25 MCG tablet [Pharmacy Med Name: LEVOTHYROXINE 25 MCG TABLET] 90 tablet 0    Sig: TAKE 1 TABLET BY MOUTH EVERY DAY BEFORE BREAKFAST     Endocrinology:  Hypothyroid Agents Failed - 02/20/2019  1:30 AM      Failed - TSH needs to be rechecked within 3 months after an abnormal result. Refill until TSH is due.      Failed - TSH in normal range and within 360 days    TSH  Date Value Ref Range Status  05/25/2017 4.260 0.450 - 4.500 uIU/mL Final         Passed - Valid encounter within last 12 months    Recent Outpatient Visits          6 months ago Encounter for health maintenance examination in adult   Primary Care at [facility], Manus Rudd, MD   1 year ago Encounter for health maintenance examination in adult   Primary Care at [facility], [place] A, MD   2 years ago Hypothyroidism, unspecified type   [facility] at [facility], Newt Lukes, MD   5 years ago Blepharitis of both eyes   Primary Care at [facility], Thao P, DO

## 2019-05-07 ENCOUNTER — Other Ambulatory Visit: Payer: Self-pay | Admitting: Family Medicine

## 2019-06-28 ENCOUNTER — Other Ambulatory Visit: Payer: Self-pay | Admitting: Family Medicine

## 2019-08-24 ENCOUNTER — Other Ambulatory Visit: Payer: Self-pay | Admitting: Family Medicine

## 2019-08-24 NOTE — Telephone Encounter (Signed)
Forwarding medication refill request to the clinical pool for review. 

## 2019-09-01 DIAGNOSIS — Z124 Encounter for screening for malignant neoplasm of cervix: Secondary | ICD-10-CM | POA: Diagnosis not present

## 2019-09-01 DIAGNOSIS — Z139 Encounter for screening, unspecified: Secondary | ICD-10-CM | POA: Diagnosis not present

## 2019-09-01 DIAGNOSIS — Z1231 Encounter for screening mammogram for malignant neoplasm of breast: Secondary | ICD-10-CM | POA: Diagnosis not present

## 2019-09-01 DIAGNOSIS — Z01419 Encounter for gynecological examination (general) (routine) without abnormal findings: Secondary | ICD-10-CM | POA: Diagnosis not present

## 2019-09-01 DIAGNOSIS — E559 Vitamin D deficiency, unspecified: Secondary | ICD-10-CM | POA: Diagnosis not present

## 2019-09-01 DIAGNOSIS — D649 Anemia, unspecified: Secondary | ICD-10-CM | POA: Diagnosis not present

## 2019-10-18 ENCOUNTER — Ambulatory Visit: Payer: BLUE CROSS/BLUE SHIELD | Admitting: Family Medicine

## 2019-10-18 ENCOUNTER — Other Ambulatory Visit: Payer: Self-pay

## 2019-10-18 ENCOUNTER — Encounter: Payer: Self-pay | Admitting: Family Medicine

## 2019-10-18 VITALS — BP 136/85 | HR 80 | Temp 98.6°F | Ht 60.75 in | Wt 146.8 lb

## 2019-10-18 DIAGNOSIS — E039 Hypothyroidism, unspecified: Secondary | ICD-10-CM | POA: Diagnosis not present

## 2019-10-18 DIAGNOSIS — Z23 Encounter for immunization: Secondary | ICD-10-CM | POA: Diagnosis not present

## 2019-10-18 DIAGNOSIS — E559 Vitamin D deficiency, unspecified: Secondary | ICD-10-CM | POA: Diagnosis not present

## 2019-10-18 NOTE — Progress Notes (Signed)
Established Patient Office Visit  Subjective:  Patient ID: Allison [name], female    DOB: 1971-08-18  Age: 48 y.o. MRN: [id number]  CC:  Chief Complaint  Patient presents with  . Hypothyroidism    need refill on meds    HPI [place] presents for   Hypothyroidism: Patient presents for evaluation of thyroid function. Symptoms consist of denies fatigue, weight changes, heat/cold intolerance, bowel/skin changes or CVS symptoms.  The problem has been stable.  Previous thyroid studies include TSH. The hypothyroidism is due to hypothyroidism.  Lab Results  Component Value Date   TSH 3.510 10/18/2019   Vitamin D Pt is taking vitamin d and wanted her levels checked  Past Medical History:  Diagnosis Date  . Anemia   . Thyroid disease     History reviewed. No pertinent surgical history.  Family History  Problem Relation Age of Onset  . High blood pressure Sister     Social History   Socioeconomic History  . Marital status: Married    Spouse name: Not on file  . Number of children: Not on file  . Years of education: Not on file  . Highest education level: Not on file  Occupational History  . Not on file  Social Needs  . Financial resource strain: Not on file  . Food insecurity    Worry: Not on file    Inability: Not on file  . Transportation needs    Medical: Not on file    Non-medical: Not on file  Tobacco Use  . Smoking status: Never Smoker  . Smokeless tobacco: Never Used  Substance and Sexual Activity  . Alcohol use: No  . Drug use: No  . Sexual activity: Yes    Birth control/protection: None  Lifestyle  . Physical activity    Days per week: Not on file    Minutes per session: Not on file  . Stress: Not on file  Relationships  . Social Herbalist on phone: Not on file    Gets together: Not on file    Attends religious service: Not on file    Active member of club or organization: Not on file    Attends meetings of clubs or organizations: Not on  file    Relationship status: Not on file  . Intimate partner violence    Fear of current or ex partner: Not on file    Emotionally abused: Not on file    Physically abused: Not on file    Forced sexual activity: Not on file  Other Topics Concern  . Not on file  Social History Narrative  . Not on file    Outpatient Medications Prior to Visit  Medication Sig Dispense Refill  . levonorgestrel (MIRENA) 20 MCG/24HR IUD 1 each by Intrauterine route once.    Marland Kitchen levothyroxine (SYNTHROID) 25 MCG tablet TAKE 1 TABLET BY MOUTH EVERY DAY BEFORE BREAKFAST. SCHEDULE A OFFICE VISIT 30 tablet 1   No facility-administered medications prior to visit.     No Known Allergies  ROS Review of Systems Review of Systems  Constitutional: Negative for activity change, appetite change, chills and fever.  HENT: Negative for congestion, nosebleeds, trouble swallowing and voice change.   Respiratory: Negative for cough, shortness of breath and wheezing.   Gastrointestinal: Negative for diarrhea, nausea and vomiting.  Genitourinary: Negative for difficulty urinating, dysuria, flank pain and hematuria.  Musculoskeletal: Negative for back pain, joint swelling and neck pain.  Neurological: Negative for dizziness,  speech difficulty, light-headedness and numbness.  See HPI. All other review of systems negative.     Objective:    Physical Exam  BP 136/85   Pulse 80   Temp 98.6 F (37 C)   Ht 5' 0.75" (1.543 m)   Wt 146 lb 12.8 oz (66.6 kg)   SpO2 100%   BMI 27.97 kg/m  Wt Readings from Last 3 Encounters:  10/18/19 146 lb 12.8 oz (66.6 kg)  08/13/18 143 lb 6.4 oz (65 kg)  05/25/17 142 lb 12.8 oz (64.8 kg)   Physical Exam  Constitutional: Oriented to person, place, and time. Appears well-developed and well-nourished.  HENT:  Head: Normocephalic and atraumatic.  Eyes: Conjunctivae and EOM are normal.  Neck: no thyromegaly, supple Cardiovascular: Normal rate, regular rhythm, normal heart sounds and  intact distal pulses.  No murmur heard. Pulmonary/Chest: Effort normal and breath sounds normal. No stridor. No respiratory distress. Has no wheezes.  Neurological: Is alert and oriented to person, place, and time.  Skin: Skin is warm. Capillary refill takes less than 2 seconds.  Psychiatric: Has a normal mood and affect. Behavior is normal. Judgment and thought content normal.    There are no preventive care reminders to display for this patient.  There are no preventive care reminders to display for this patient.  Lab Results  Component Value Date   TSH 3.510 10/18/2019   Lab Results  Component Value Date   WBC 6.4 08/13/2018   HGB 13.4 08/13/2018   HCT 42.1 08/13/2018   MCV 82 08/13/2018   PLT 164 08/13/2018   Lab Results  Component Value Date   NA 140 08/13/2018   K 3.9 08/13/2018   CO2 24 08/13/2018   GLUCOSE 69 08/13/2018   BUN 8 08/13/2018   CREATININE 0.71 08/13/2018   BILITOT 0.3 08/13/2018   ALKPHOS 45 08/13/2018   AST 13 08/13/2018   ALT 9 08/13/2018   PROT 6.8 08/13/2018   ALBUMIN 4.0 08/13/2018   CALCIUM 9.2 08/13/2018   Lab Results  Component Value Date   CHOL 180 08/13/2018   Lab Results  Component Value Date   HDL 49 08/13/2018   Lab Results  Component Value Date   LDLCALC 114 (H) 08/13/2018   Lab Results  Component Value Date   TRIG 84 08/13/2018   Lab Results  Component Value Date   CHOLHDL 3.7 08/13/2018   Lab Results  Component Value Date   HGBA1C 5.5 08/13/2018      Assessment & Plan:   Problem List Items Addressed This Visit    None    Visit Diagnoses    Need for prophylactic vaccination and inoculation against influenza       Relevant Orders   Flu Vaccine QUAD 36+ mos IM (Completed)   Hypothyroidism, acquired    -  Will check levels, pt is compliant   Relevant Orders   TSH (Completed)   Vitamin D deficiency    -  Will assess levels   Relevant Orders   VITAMIN D 25 Hydroxy (Vit-D Deficiency, Fractures) (Completed)       No orders of the defined types were placed in this encounter.   Follow-up: No follow-ups on file.    Doristine Bosworth, MD

## 2019-10-18 NOTE — Patient Instructions (Addendum)
If you have lab work done today you will be contacted with your lab results within the next 2 weeks.  If you have not heard from [place] then please contact us. The fastest way to get your results is to register for My Chart.   IF you received an x-ray today, you will receive an invoice from [facility]. Please contact [facility] at [phone] with questions or concerns regarding your invoice.   IF you received labwork today, you will receive an invoice from [facility]. Please contact [facility] at [phone] with questions or concerns regarding your invoice.   Our billing staff will not be able to assist you with questions regarding bills from these companies.  You will be contacted with the lab results as soon as they are available. The fastest way to get your results is to activate your My Chart account. Instructions are located on the last page of this paperwork. If you have not heard from [place] regarding the results in 2 weeks, please contact this office.     Nh???c gia?p/suy gip Hypothyroidism  Nh??c gip l khi tuy?n gip khng t?o ra ?? m?t s? lo?i hc mn nh?t ??nh (tuy?n gip km ho?t ??ng). Tuy?n gip l m?t tuy?n nh? n?m trong ph?n d??i pha tr??c c?a c?, ngay tr??c kh qu?n (kh qu?n). Tuy?n ny t?o ra cc hc mn gip ki?m sot cch th?c c? th? s? d?ng th?c ?n ?? t?o ra n?ng l??ng (chuy?n ha) c?ng nh? cch th?c ho?t ??ng c?a tim v no. Nh?ng hc mn ny c?ng ?ng m?t vai tr trong vi?c gi? cho x??ng ch?c kh?e. Khi tuy?n gip km ho?t ??ng, n s?n xu?t qu t cc hc mn thyroxine (T4) v triiodothyronine (T3). Nguyn nhn g gy ra? Tnh tr?ng ny c th? do:  B?nh Hashimoto. ?y l m?t b?nh trong ? h? th?ng phng ch?ng b?nh t?t (h? mi?n d?ch) c?a c? th? t?n cng tuy?n gip. ?y l nguyn nhn ph? bi?n nh?t.  Nhi?m vi rt.  Mang [place].  M?t s? lo?i thu?c nh?t ??nh.  D? t?t b?m sinh.  Tr??c ?y ? x? tr? ??u ho?c c? ?? ?i?u tr? ung th?.  Tr??c ?y ?  ?i?u tr? b?ng i ?t phng x?.  Tr??c ?y ? ph?i nhi?m phng x? trong mi tr??ng.  Tr??c ?y ? ph?u thu?t c?t b? m?t ph?n ho?c ton b? tuy?n gip.  Cc v?n ?? v?i m?t tuy?n n?m ? gi?a no (tuy?n yn).  Khng ?? i-?t trong ch? ?? ?n. ?i?u g lm t?ng nguy c?? Qu v? d? b? tnh tr?ng ny h?n n?u:  Quy? vi? la? phu? n??Ladell Heads v? c ti?n s? gia ?nh bi? b?nh tuy?n gip.  Qu v? s? d?ng m?t lo?i thu?c c tn l lithium.  Qu v? dng thu?c ?nh h??ng ??n h? mi?n d?ch (thu?c ?c ch? mi?n d?ch). Cc d?u hi?u ho?c tri?u ch?ng l g? Nh?ng tri?u ch?ng c?a tnh tr?ng ny bao g?m:  C?m th?y nh? l qu v? khng c n?ng l??ng (l? ph?).  Khng th? ch?u ???c l?nh.  T?ng cn khng th? gi?i thch b?ng thay ??i ch? ?? ?n ho?c thi quen t?p th? d?c.  Chn ?n.  Da kh.  Tc x?.  Kinh nguy?t khng ??u.  Suy ngh? ch?m ch?p.  To bn.  Bu?n b ho?c tr?m c?m. Ch?n ?on tnh tr?ng ny nh? th? no? Tnh tr?ng ny c th? ???c ch?n ?on d?a vo:  Cc tri?u ch?ng,  b?nh s? c?a qu v? v khm th?c th?.  Xt nghi?m mu. Quy? vi? c?ng c th? ???c la?m ca?c ki?m tra hnh ?nh nh? siu m ho?c MRI. Tnh tr?ng ny ???c ?i?u tr? nh? th? no? Tnh tr?ng ny ???c ?i?u tr? b?ng thu?c thay th? cc hc mn tuy?n gip m c? th? khng t?o ra. Sau khi qu v? b?t ??u ?i?u tr?, c th? m?t vi tu?n ?? h?t cc tri?u ch?ng. Tun th? nh?ng h??ng d?n ny ? nh:  Ch? s? d?ng thu?c khng k ??n v thu?c k ??n theo ch? d?n c?a chuyn gia ch?m [place] s?c kh?e.  N?u qu v? b?t ??u dng b?t c? lo?i thu?c m?i no, hy ni v?i chuyn gia ch?m [place] s?c kh?e.  Tun th? t?t c? cc l?n khm theo di theo ch? d?n c?a chuyn gia ch?m [place] s?c kh?e. ?i?u ny c vai tr quan tr?ng. ? Khi tnh tr?ng c?a qu v? c?i thi?n, nhu c?u v? li?u l??ng thu?c hc mn tuy?n gip c?a qu v? c th? thay ??i. ? Qu v? s? c?n lm xt nghi?m mu ??nh k? ?? chuyn gia ch?m [place] s?c kh?e c th? theo di tnh tr?ng c?a qu v?. Hy lin l?c v?i chuyn  gia ch?m [place] s?c kh?e n?u:  Cc tri?u ch?ng c?a qu v? khng ?? h?n sau khi ???c ?i?u tr?Sander Nephew v? ?ang dng thu?c thay th? tuy?n gip v qu v?: ? ?? nhi?u m? hi. ? B? rng mnh. ? Ca?m th?y lo l??ng. ? Gi?m cn nhanh. ? Khng th? ch?u ???c nhi?t. ? B? dao ??ng c?m xc. ? B? tiu ch?y. ? C?m th?y y?u ?t. Yu c?u tr? gip ngay l?p t?c n?u qu v? b?:  ?au ng?c.  Nh?p tim khng ??u.  Nh?p tim nhanh.  Kh th?. Tm t?t  Nh??c gip l khi tuy?n gip khng t?o ra ?? m?t s? lo?i hc mn nh?t ??nh (tuy?n gip km ho?t ??ng).  Khi tuy?n gip km ho?t ??ng, n s?n xu?t qu t cc hc mn thyroxine (T4) v triiodothyronine (T3).  Nguyn nhn th??ng g?p nh?t l b?nh Hashimoto, m?t b?nh trong ? h? th?ng phng ch?ng b?nh t?t (h? mi?n d?ch) c?a c? th? t?n cng tuy?n gip. Tnh tr?ng ny c?ng c th? do nhi?m vi-rt, thu?c, mang thai ho?c x? tr? ??y ho?c c? tr??c ?y gy ra.  Cc tri?u ch?ng c th? bao g?m t?ng cn, da kh, to bn, c?m th?y nh? th? qu v? khng c n?ng l??ng v khng th? ch?u ???c l?nh.  Tnh tr?ng ny ???c ?i?u tr? b?ng thu?c thay th? cc hc mn tuy?n gip m c? th? khng t?o ra. Thng tin ny khng nh?m m?c ?ch thay th? cho l?i khuyn m chuyn gia ch?m [place] s?c kh?e ni v?i qu v?. Hy b?o ??m qu v? ph?i th?o lu?n b?t k? v?n ?? g m qu v? c v?i chuyn gia ch?m [place] s?c kh?e c?a qu v?. Document Released: 12/07/2005 Document Revised: 03/19/2018 Document Reviewed: 12/16/2017 Elsevier Patient Education  2020 [employer].

## 2019-10-19 LAB — VITAMIN D 25 HYDROXY (VIT D DEFICIENCY, FRACTURES): Vit D, 25-Hydroxy: 53.5 ng/mL (ref 30.0–100.0)

## 2019-10-19 LAB — TSH: TSH: 3.51 u[IU]/mL (ref 0.450–4.500)

## 2019-10-23 ENCOUNTER — Other Ambulatory Visit: Payer: Self-pay | Admitting: Family Medicine

## 2019-10-24 ENCOUNTER — Other Ambulatory Visit: Payer: Self-pay | Admitting: Family Medicine

## 2019-10-24 MED ORDER — LEVOTHYROXINE SODIUM 25 MCG PO TABS: ORAL_TABLET | ORAL | 3 refills | Status: DC

## 2019-11-09 ENCOUNTER — Encounter: Payer: Self-pay | Admitting: Radiology

## 2020-09-09 LAB — HM PAP SMEAR: HM Pap smear: NORMAL

## 2020-09-12 LAB — HM MAMMOGRAPHY: HM Mammogram: NORMAL (ref 0–4)

## 2020-09-19 DIAGNOSIS — D696 Thrombocytopenia, unspecified: Secondary | ICD-10-CM | POA: Diagnosis not present

## 2020-09-19 DIAGNOSIS — R7989 Other specified abnormal findings of blood chemistry: Secondary | ICD-10-CM | POA: Diagnosis not present

## 2020-09-19 DIAGNOSIS — Z124 Encounter for screening for malignant neoplasm of cervix: Secondary | ICD-10-CM | POA: Diagnosis not present

## 2020-09-19 DIAGNOSIS — Z01419 Encounter for gynecological examination (general) (routine) without abnormal findings: Secondary | ICD-10-CM | POA: Diagnosis not present

## 2020-09-19 DIAGNOSIS — Z1231 Encounter for screening mammogram for malignant neoplasm of breast: Secondary | ICD-10-CM | POA: Diagnosis not present

## 2020-09-19 DIAGNOSIS — E559 Vitamin D deficiency, unspecified: Secondary | ICD-10-CM | POA: Diagnosis not present

## 2020-10-28 ENCOUNTER — Ambulatory Visit: Payer: BC Managed Care – PPO | Admitting: Family Medicine

## 2020-10-28 ENCOUNTER — Encounter: Payer: Self-pay | Admitting: Family Medicine

## 2020-10-28 ENCOUNTER — Other Ambulatory Visit: Payer: Self-pay

## 2020-10-28 VITALS — BP 135/87 | HR 72 | Temp 97.3°F | Ht 60.75 in | Wt 148.0 lb

## 2020-10-28 DIAGNOSIS — Z13220 Encounter for screening for lipoid disorders: Secondary | ICD-10-CM | POA: Diagnosis not present

## 2020-10-28 DIAGNOSIS — E039 Hypothyroidism, unspecified: Secondary | ICD-10-CM | POA: Diagnosis not present

## 2020-10-28 DIAGNOSIS — E559 Vitamin D deficiency, unspecified: Secondary | ICD-10-CM

## 2020-10-28 DIAGNOSIS — Z131 Encounter for screening for diabetes mellitus: Secondary | ICD-10-CM | POA: Diagnosis not present

## 2020-10-28 MED ORDER — LEVOTHYROXINE SODIUM 25 MCG PO TABS: ORAL_TABLET | ORAL | 3 refills | Status: DC

## 2020-10-28 NOTE — Patient Instructions (Addendum)
Nh???c gia?p/suy gip Hypothyroidism  Nh??c gip l khi tuy?n gip khng t?o ra ?? m?t s? lo?i hc mn nh?t ??nh (tuy?n gip km ho?t ??ng). Tuy?n gip l m?t tuy?n nh? n?m trong ph?n d??i pha tr??c c?a c?, ngay tr??c kh qu?n (kh qu?n). Tuy?n ny t?o ra cc hc mn gip ki?m sot cch th?c c? th? s? d?ng th?c ?n ?? t?o ra n?ng l??ng (chuy?n ha) c?ng nh? cch th?c ho?t ??ng c?a tim v no. Nh?ng hc mn ny c?ng ?ng m?t vai tr trong vi?c gi? cho x??ng ch?c kh?e. Khi tuy?n gip km ho?t ??ng, n s?n xu?t qu t cc hc mn thyroxine (T4) v triiodothyronine (T3). Nguyn nhn g gy ra? Tnh tr?ng ny c th? do:  B?nh Hashimoto. ?y l m?t b?nh trong ? h? th?ng phng ch?ng b?nh t?t (h? mi?n d?ch) c?a c? th? t?n cng tuy?n gip. ?y l nguyn nhn ph? bi?n nh?t.  Nhi?m vi rt.  Mang [place].  M?t s? lo?i thu?c nh?t ??nh.  D? t?t b?m sinh.  Tr??c ?y ? x? tr? ??u ho?c c? ?? ?i?u tr? ung th?.  Tr??c ?y ? ?i?u tr? b?ng i ?t phng x?.  Tr??c ?y ? ph?i nhi?m phng x? trong mi tr??ng.  Tr??c ?y ? ph?u thu?t c?t b? m?t ph?n ho?c ton b? tuy?n gip.  Cc v?n ?? v?i m?t tuy?n n?m ? gi?a no (tuy?n yn).  Khng ?? i-?t trong ch? ?? ?n. ?i?u g lm t?ng nguy c?? Qu v? d? b? tnh tr?ng ny h?n n?u:  Quy? vi? la? phu? n??Ladell Heads v? c ti?n s? gia ?nh bi? b?nh tuy?n gip.  Qu v? s? d?ng m?t lo?i thu?c c tn l lithium.  Qu v? dng thu?c ?nh h??ng ??n h? mi?n d?ch (thu?c ?c ch? mi?n d?ch). Cc d?u hi?u ho?c tri?u ch?ng l g? Nh?ng tri?u ch?ng c?a tnh tr?ng ny bao g?m:  C?m th?y nh? l qu v? khng c n?ng l??ng (l? ph?).  Khng th? ch?u ???c l?nh.  T?ng cn khng th? gi?i thch b?ng thay ??i ch? ?? ?n ho?c thi quen t?p th? d?c.  Chn ?n.  Da kh.  Tc x?.  Kinh nguy?t khng ??u.  Suy ngh? ch?m ch?p.  To bn.  Bu?n b ho?c tr?m c?m. Ch?n ?on tnh tr?ng ny nh? th? no? Tnh tr?ng ny c th? ???c ch?n ?on d?a vo:  Cc tri?u ch?ng, b?nh s?  c?a qu v? v khm th?c th?.  Xt nghi?m mu. Quy? vi? c?ng c th? ???c la?m ca?c ki?m tra hnh ?nh nh? siu m ho?c MRI. Tnh tr?ng ny ???c ?i?u tr? nh? th? no? Tnh tr?ng ny ???c ?i?u tr? b?ng thu?c thay th? cc hc mn tuy?n gip m c? th? khng t?o ra. Sau khi qu v? b?t ??u ?i?u tr?, c th? m?t vi tu?n ?? h?t cc tri?u ch?ng. Tun th? nh?ng h??ng d?n ny ? nh:  Ch? s? d?ng thu?c khng k ??n v thu?c k ??n theo ch? d?n c?a chuyn gia ch?m [place] s?c kh?e.  N?u qu v? b?t ??u dng b?t c? lo?i thu?c m?i no, hy ni v?i chuyn gia ch?m [place] s?c kh?e.  Tun th? t?t c? cc l?n khm theo di theo ch? d?n c?a chuyn gia ch?m [place] s?c kh?e. ?i?u ny c vai tr quan tr?ng. ? Khi tnh tr?ng c?a qu v? c?i thi?n, nhu c?u v? li?u l??ng thu?c hc mn tuy?n gip c?a qu v?  c th? thay ??i. ? Qu v? s? c?n lm xt nghi?m mu ??nh k? ?? chuyn gia ch?m [place] s?c kh?e c th? theo di tnh tr?ng c?a qu v?. Hy lin l?c v?i chuyn gia ch?m [place] s?c kh?e n?u:  Cc tri?u ch?ng c?a qu v? khng ?? h?n sau khi ???c ?i?u tr?Ladell Heads v? ?ang dng thu?c thay th? tuy?n gip v qu v?: ? ?? nhi?u m? hi. ? B? rng mnh. ? Ca?m th?y lo l??ng. ? Gi?m cn nhanh. ? Khng th? ch?u ???c nhi?t. ? B? dao ??ng c?m xc. ? B? tiu ch?y. ? C?m th?y y?u ?t. Yu c?u tr? gip ngay l?p t?c n?u qu v? b?:  ?au ng?c.  Nh?p tim khng ??u.  Nh?p tim nhanh.  Kh th?. Tm t?t  Nh??c gip l khi tuy?n gip khng t?o ra ?? m?t s? lo?i hc mn nh?t ??nh (tuy?n gip km ho?t ??ng).  Khi tuy?n gip km ho?t ??ng, n s?n xu?t qu t cc hc mn thyroxine (T4) v triiodothyronine (T3).  Nguyn nhn th??ng g?p nh?t l b?nh Hashimoto, m?t b?nh trong ? h? th?ng phng ch?ng b?nh t?t (h? mi?n d?ch) c?a c? th? t?n cng tuy?n gip. Tnh tr?ng ny c?ng c th? do nhi?m vi-rt, thu?c, mang thai ho?c x? tr? ??y ho?c c? tr??c ?y gy ra.  Cc tri?u ch?ng c th? bao g?m t?ng cn, da kh, to bn, c?m th?y nh? th? qu v? khng c n?ng  l??ng v khng th? ch?u ???c l?nh.  Tnh tr?ng ny ???c ?i?u tr? b?ng thu?c thay th? cc hc mn tuy?n gip m c? th? khng t?o ra. Thng tin ny khng nh?m m?c ?ch thay th? cho l?i khuyn m chuyn gia ch?m [place] s?c kh?e ni v?i qu v?. Hy b?o ??m qu v? ph?i th?o lu?n b?t k? v?n ?? g m qu v? c v?i chuyn gia ch?m [place] s?c kh?e c?a qu v?. Document Revised: 03/19/2018 Document Reviewed: 12/16/2017 Elsevier Patient Education  2020 [employer] Inc.  Sng l?c ung th? ??i tr?c trng Colorectal Cancer Screening  Sng l?c ung th? ??i tr?c trng l m?t nhm xt nghi?m ???c s? d?ng ?? ki?m tra ung th? ??i tr?c trng tr??c khi cc tri?u ch?ng xu?t hi?n. ??i tr?c trng l ni ??n ??i trng v tr?c trng. ??i trng v tr?c trng n?m ? cu?i ???ng tiu ha v ??a phn ra ngoi c? th?. Ai nn sng l?c? T?t c? ng??i l?n b?t ??u ? tu?i 50 cho ??n tu?i 75 nn sng l?c. Chuyn gia ch?m  s?c kh?e c?a qu v? c th? khuy?n ngh? sng l?c ? tu?i 45. Qu v? s? ???c lm xt nghi?m 1-10 n?m m?t l?n, ty thu?c vo k?t qu? v lo?i xt nghi?m sng l?c c?a qu v?. Qu v? c th? ???c lm xt nghi?m sng l?c b?t ??u ? tu?i tr? h?n, ho?c th??ng xuyn h?n nh?ng ng??i khc, n?u qu v? c b?t k? y?u t? nguy c? no sau ?y:  Ti?n s? c nhn ho?c gia ?nh b? ung th? ??i tr?c trng ho?c cc kh?i u b?t th??ng (polyp).  B?nh vim ru?t, ch??ng ha?n nh? vim ?a?i tra?ng th? lot ho??c b?nh [place].  Ti?n s? x? tr? ung th? vng b?ng ho?c vng khung ch?u.  Cc tri?u ch?ng ung th? ??i tr?c trng, ch?ng h?n nh? thay ??i thi quen ??i ti?n ho?c c mu trong phn.  M?t lo?i h?i ch?ng ung th? ??i trng truy?n t?  cha/m? sang con ci (di truy?n), ch?ng h?n nh?: ? H?i ch?ng [place]. ? B?nh polyp tuy?n c tnh gia ?nh. ? H?i ch?ng [place]. ? H?i ch?ng Peutz-Jeghers. Khuy?n ngh? sng l?c dnh cho ng??i l?n t? 75-85 tu?i thay ??i ty theo s?c kh?e. Vi?c sng l?c ???c th?c hi?n nh? th? no? C m?t vi lo?i xt nghi?m sng l?c ??i tr?c  trng. Qu v? c th? c m?t ho?c nhi?u xt nghi?m sau:  Xt nghi?m mu ?n trong phn d?a trn [place]. ??i v?i xt nghi?m ny, m?u phn (phn) ???c ki?m tra xem c mu ?n (mu ?n) hay khng, ?y c th? l d?u hi?u c?a ung th? ??i tr?c trng.  Xt nghi?m ha mi?n d?ch phn (FIT). ??i v?i xt nghi?m ny, m?u phn ???c ki?m tra xem c mu hay khng, ?y c th? l d?u hi?u c?a ung th? ??i tr?c trng.  Xt nghi?m ADN trong phn. ??i v?i xt nghi?m ny, m?u phn ???c ki?m tra xem c mu v thay ??i v? ADN c th? d?n ??n ung th? ??i tr?c trng hay khng.  N?i soi ??i trng sigma. Trong ki?m tra ny, m?t ?ng m?nh, m?m c g?n camera ? ??u (?ng n?i soi ??i trng sigma) ???c s? d?ng ?? ki?m tra tr?c trng v ph?n d??i c?a ??i trng.  N?i soi ??i trng. Trong khi th?c hi?n ki?m tra ny, m?t ?ng m?m di c g?n m?t camera ? ??u (?ng n?i soi ??i trng) ???c s? d?ng ?? ki?m tra ton b? ??i trng v tr?c trng. V?i n?i soi ??i trng, c th? l?y m?u m (sinh thi?t) v lo?i b? cc polyp nh? trong lc ki?m tra.  N?i soi ??i trng ?o. Thay v n?i soi ??i trng, lo?i n?i soi ??i trng ny s? d?ng X quang (ch?p CT) v my vi tnh ?? t?o ra hnh ?nh ??i trng v tr?c trng. Cc l?i ch c?a sng l?c l g? Sng l?c lm gi?m nguy c? b? ung th? ??i tr?c trng v c th? gip nh?n di?n ung th? ? giai ?o?n s?m, khi ung th? c th? ???c c?t b? ho?c ???c ?i?u tr? d? dng h?n. Polyp hnh thnh trn nim m?c ??i trng c?a qu v?, ??c bi?t l khi qu v? c tu?i, l th??ng g?p. Nh?ng polyp ny c th? l ung th? ho?c tr? [place] th? theo th?i gian. Sng l?c c th? nh?n di?n nh?ng polyp ny. Cc nguy c? c?a sng l?c l g? M?i ki?m tra sng l?c ??u c nh?ng nguy c? khc nhau.  Xt nghi?m m?u phn c t nguy c? h?n so v?i cc lo?i ki?m tra sng l?c khc. Tuy nhin, qu v? c th? c?n th?c hi?n thm xt nghi?m ?? xc ??nh k?t qu? t? xt nghi?m m?u phn.  Xt nghi?m sng l?c c ch?p X quang s? khi?n cho qu v? ti?p xc v?i phng x? ? m?c  th?p, ?i?u ? c th? lm t?ng nh? nguy c? b? ung th? c?a qu v?. L?i ch c?a vi?c pht hi?n ra ung th? l?n h?n so v?i t?ng nh? nguy c?.  Cc xt nghi?m sng l?c nh? n?i soi ??i trng sigma v n?i soi ??i trng c th? khi?n qu v? c nguy c? b? ch?y mu, t?n th??ng ru?t non, nhi?m trng, ho?c ph?n ?ng v?i thu?c ???c cho dng khi ki?m tra. Trao ??i v?i chuyn gia ch?m [place] s?c kh?e c?a qu v? ?? hi?u v? nguy c?  ung th? ??i tr?c trng v ln k? ho?ch sng l?c ph h?p v?i qu v?. Ca?c cu h?i ??t ra v?i chuyn gia ch?m [place] s?c kh?e c?a qu v?  Khi no ti nn b?t ??u sng l?c ung th? ??i tr?c trng?  Nguy c? ung th? ??i tr?c trng c?a ti l g?  Ti c?n sng l?c bao lu m?t l?n?  Ti c?n lm cc ki?m tra sng l?c no?  Ti l?y k?t qu? xt nghi?m c?a mnh nh? th? no?  K?t qu? c?a ti c ngh?a l g? N?i ?? tm thm thng tin Tm hi?u thm v? sng l?c ung th? ??i tr?c trng t?:  H?i Ung th? [place]?: www.cancer.org  Vi?n Ung th? Qu?c gia: www.cancer.gov Tm t?t  Sng l?c ung th? ??i tr?c trng l m?t nhm cc ki?m tra ???c s? d?ng ?? ki?m tra ung th? ??i tr?c trng tr??c khi cc tri?u ch?ng xu?t hi?n.  Sng l?c lm gi?m nguy c? b? ung th? ??i tr?c trng v c th? gip nh?n di?n ung th? ? giai ?o?n s?m, khi ung th? c th? ???c c?t b? ho?c ???c ?i?u tr? d? dng h?n.  T?t c? ng??i l?n b?t ??u ? tu?i 50 cho ??n tu?i 75 nn sng l?c. Chuyn gia ch?m [place] s?c kh?e c?a qu v? c th? khuy?n ngh? sng l?c ? tu?i 45.  Qu v? c th? ???c lm xt nghi?m sng l?c b?t ??u ? tu?i tr? h?n, ho?c th??ng xuyn h?n nh?ng ng??i khc, n?u qu v? c cc y?u t? nguy c? nh?t ??nh.  Trao ??i v?i chuyn gia ch?m [place] s?c kh?e c?a qu v? ?? hi?u v? nguy c? ung th? ??i tr?c trng v ln k? ho?ch sng l?c ph h?p v?i qu v?. Thng tin ny khng nh?m m?c ?ch thay th? cho l?i khuyn m chuyn gia ch?m [place] s?c kh?e ni v?i qu v?. Hy b?o ??m qu v? ph?i th?o lu?n b?t k? v?n ?? g m qu v? c v?i chuyn gia ch?m  s?c  kh?e c?a qu v?. Document Revised: 03/18/2018 Document Reviewed: 11/05/2017 Else[employer] Patient Education  [employer]2020 [employer]  If you have lab work done today you will be contacted with your lab results within the next 2 weeks.  If you have not heard from us then please contact us. The fastest way to get your results is to register for My Chart.   IF you received an x-ray today, you will receive an invoice from [facility]. Please contact [facility] at [phone] with questions or concerns regarding your invoice.   IF you received labwork today, you will receive an invoice from [facility]. Please contact [facility] at [phone] with questions or concerns regarding your invoice.   Our billing staff will not be able to assist you with questions regarding bills from these companies.  You will be contacted with the lab results as soon as they are available. The fastest way to get your results is to activate your My Chart account. Instructions are located on the last page of this paperwork. If you have not heard from us regarding the results in 2 weeks, please contact this office.

## 2020-10-28 NOTE — Progress Notes (Addendum)
11/8/20219:19 AM  Allison [name] 07/30/1971, 49 y.o., female [id number]  Chief Complaint  Patient presents with  . Hypothyroidism    med refill    HPI:   Patient is a 49 y.o. female with past medical history significant for hypothyroidism who presents today for medication refill.  Declined interpreter services, husband interpreted.  Hypothyroidism: Patient presents for evaluation of thyroid function. Symptoms consist of denies fatigue, weight changes, heat/cold intolerance, bowel/skin changes or CVS symptoms.  The problem has been stable.  Previous thyroid studies include TSH. The hypothyroidism is due to hypothyroidism. Lab Results  Component Value Date   TSH 3.510 10/18/2019    Vitamin D Pt is taking vitamin d and wanted her levels checked Last vitamin D Lab Results  Component Value Date   VD25OH 53.5 10/18/2019   Every other day supplement Mirena placed 05/25/2017 Mammogram: 09/19/2020 Pap: 09/09/20 per family Colon Cancer screen: declines at this time, will send information Covid: done Flu: declines at this time    Depression screen [facility] 2/9 10/28/2020 10/18/2019 05/25/2017  Decreased Interest 0 0 0  Down, Depressed, Hopeless 0 0 0  PHQ - 2 Score 0 0 0    Fall Risk  10/28/2020 10/18/2019 05/25/2017 09/26/2016  Falls in the past year? 0 0 No No  Number falls in past yr: 0 0 - -  Injury with Fall? 0 0 - -  Follow up Falls evaluation completed - - -     No Known Allergies  Prior to Admission medications   Medication Sig Start Date End Date Taking? Authorizing Provider  levonorgestrel (MIRENA) 20 MCG/24HR IUD 1 each by Intrauterine route once.    [provider]  levothyroxine (SYNTHROID) 25 MCG tablet Take one tablet by mouth every day before breakfast on an empty stomach 30-60 minutes before eating or taking other meds. 10/24/19   Doristine Bosworth, MD    Past Medical History:  Diagnosis Date  . Anemia   . Thyroid disease     History reviewed. No pertinent  surgical history.  Social History   Tobacco Use  . Smoking status: Never Smoker  . Smokeless tobacco: Never Used  Substance Use Topics  . Alcohol use: No    Family History  Problem Relation Age of Onset  . High blood pressure Sister     Review of Systems  Constitutional: Negative for chills, fever and malaise/fatigue.  Eyes: Negative for blurred vision and double vision.  Respiratory: Negative for cough, shortness of breath and wheezing.   Cardiovascular: Negative for chest pain, palpitations and leg swelling.  Gastrointestinal: Negative for abdominal pain, blood in stool, constipation, diarrhea, heartburn, nausea and vomiting.  Genitourinary: Negative for dysuria, frequency and hematuria.  Musculoskeletal: Negative for back pain and joint pain.  Skin: Negative for rash.  Neurological: Negative for dizziness, weakness and headaches.    OBJECTIVE:  Today's Vitals   10/28/20 0900  BP: 135/87  Pulse: 72  Temp: (!) 97.3 F (36.3 C)  SpO2: 99%  Weight: 148 lb (67.1 kg)  Height: 5' 0.75" (1.543 m)   Body mass index is 28.19 kg/m.   Physical Exam Constitutional:      General: She is not in acute distress.    Appearance: Normal appearance. She is not ill-appearing.  HENT:     Head: Normocephalic.  Cardiovascular:     Rate and Rhythm: Normal rate and regular rhythm.     Pulses: Normal pulses.     Heart sounds: Normal heart sounds. No murmur  heard.  No friction rub. No gallop.   Pulmonary:     Effort: Pulmonary effort is normal. No respiratory distress.     Breath sounds: Normal breath sounds. No stridor. No wheezing, rhonchi or rales.  Abdominal:     General: Bowel sounds are normal.     Palpations: Abdomen is soft.     Tenderness: There is no abdominal tenderness.  Musculoskeletal:     Right lower leg: No edema.     Left lower leg: No edema.  Skin:    General: Skin is warm and dry.  Neurological:     Mental Status: She is alert and oriented to person,  place, and time.  Psychiatric:        Mood and Affect: Mood normal.        Behavior: Behavior normal.     No results found for this or any previous visit (from the past 24 hour(s)).  No results found.   ASSESSMENT and PLAN  Problem List Items Addressed This Visit    None    Visit Diagnoses    Vitamin D deficiency    -  Primary   Relevant Orders   Vitamin D, 25-hydroxy   Acquired hypothyroidism       Relevant Medications   levothyroxine (SYNTHROID) 25 MCG tablet   Other Relevant Orders   TSH   CBC   Comprehensive metabolic panel   Diabetes mellitus screening       Relevant Orders   Hemoglobin A1c   Lipid screening       Relevant Orders   Lipid Panel     Will follow up with labs. Refills sent.   Return in about 1 year (around 10/28/2021).   Macario Carls Yehuda Printup, FNP-BC [facility] [address] [place], [place] [address] Ph.  [phone] Fax [phone]  I have reviewed and agree with above documentation. Edwina Barth, MD

## 2020-10-29 LAB — CBC
Hematocrit: 42 % (ref 34.0–46.6)
Hemoglobin: 13.7 g/dL (ref 11.1–15.9)
MCH: 26.8 pg (ref 26.6–33.0)
MCHC: 32.6 g/dL (ref 31.5–35.7)
MCV: 82 fL (ref 79–97)
Platelets: 186 10*3/uL (ref 150–450)
RBC: 5.12 x10E6/uL (ref 3.77–5.28)
RDW: 13.2 % (ref 11.7–15.4)
WBC: 5.8 10*3/uL (ref 3.4–10.8)

## 2020-10-29 LAB — HEMOGLOBIN A1C
Est. average glucose Bld gHb Est-mCnc: 108 mg/dL
Hgb A1c MFr Bld: 5.4 % (ref 4.8–5.6)

## 2020-10-29 LAB — LIPID PANEL
Chol/HDL Ratio: 4.8 ratio — ABNORMAL HIGH (ref 0.0–4.4)
Cholesterol, Total: 223 mg/dL — ABNORMAL HIGH (ref 100–199)
HDL: 46 mg/dL
LDL Chol Calc (NIH): 160 mg/dL — ABNORMAL HIGH (ref 0–99)
Triglycerides: 95 mg/dL (ref 0–149)
VLDL Cholesterol Cal: 17 mg/dL (ref 5–40)

## 2020-10-29 LAB — COMPREHENSIVE METABOLIC PANEL
ALT: 11 IU/L (ref 0–32)
AST: 17 IU/L (ref 0–40)
Albumin/Globulin Ratio: 1.4 (ref 1.2–2.2)
Albumin: 4.2 g/dL (ref 3.8–4.8)
Alkaline Phosphatase: 61 IU/L (ref 44–121)
BUN/Creatinine Ratio: 9 (ref 9–23)
BUN: 7 mg/dL (ref 6–24)
Bilirubin Total: 0.4 mg/dL (ref 0.0–1.2)
CO2: 24 mmol/L (ref 20–29)
Calcium: 9.2 mg/dL (ref 8.7–10.2)
Chloride: 101 mmol/L (ref 96–106)
Creatinine, Ser: 0.8 mg/dL (ref 0.57–1.00)
GFR calc Af Amer: 100 mL/min/{1.73_m2} (ref >59)
GFR calc non Af Amer: 87 mL/min/{1.73_m2} (ref >59)
Globulin, Total: 3.1 g/dL (ref 1.5–4.5)
Glucose: 79 mg/dL (ref 65–99)
Potassium: 3.9 mmol/L (ref 3.5–5.2)
Sodium: 138 mmol/L (ref 134–144)
Total Protein: 7.3 g/dL (ref 6.0–8.5)

## 2020-10-29 LAB — VITAMIN D 25 HYDROXY (VIT D DEFICIENCY, FRACTURES): Vit D, 25-Hydroxy: 33.3 ng/mL (ref 30.0–100.0)

## 2020-10-29 LAB — TSH: TSH: 2.79 u[IU]/mL (ref 0.450–4.500)

## 2020-10-29 NOTE — Progress Notes (Signed)
Hello If you could let Allison [name] know her TSH is stable and no changes are needed for her Levothyroxine. Her cholesterol levels are elevated, but no medications are indicated at this time. Continue to work on diet and exercise. Thanks!

## 2020-10-31 ENCOUNTER — Encounter: Payer: Self-pay | Admitting: Radiology

## 2021-09-25 DIAGNOSIS — Z124 Encounter for screening for malignant neoplasm of cervix: Secondary | ICD-10-CM | POA: Diagnosis not present

## 2021-09-25 DIAGNOSIS — E559 Vitamin D deficiency, unspecified: Secondary | ICD-10-CM | POA: Diagnosis not present

## 2021-09-25 DIAGNOSIS — Z139 Encounter for screening, unspecified: Secondary | ICD-10-CM | POA: Diagnosis not present

## 2021-09-25 DIAGNOSIS — R7989 Other specified abnormal findings of blood chemistry: Secondary | ICD-10-CM | POA: Diagnosis not present

## 2021-09-25 DIAGNOSIS — Z1231 Encounter for screening mammogram for malignant neoplasm of breast: Secondary | ICD-10-CM | POA: Diagnosis not present

## 2021-09-25 DIAGNOSIS — Z01419 Encounter for gynecological examination (general) (routine) without abnormal findings: Secondary | ICD-10-CM | POA: Diagnosis not present

## 2021-10-02 DIAGNOSIS — Z1211 Encounter for screening for malignant neoplasm of colon: Secondary | ICD-10-CM | POA: Diagnosis not present

## 2021-10-25 ENCOUNTER — Other Ambulatory Visit: Payer: Self-pay

## 2021-10-25 ENCOUNTER — Ambulatory Visit (HOSPITAL_COMMUNITY)
Admission: EM | Admit: 2021-10-25 | Discharge: 2021-10-25 | Disposition: A | Discharge status: Home / Self Care | Payer: BC Managed Care – PPO | Attending: Physician Assistant | Admitting: Physician Assistant

## 2021-10-25 ENCOUNTER — Encounter (HOSPITAL_COMMUNITY): Payer: Self-pay

## 2021-10-25 DIAGNOSIS — Z760 Encounter for issue of repeat prescription: Secondary | ICD-10-CM

## 2021-10-25 DIAGNOSIS — E039 Hypothyroidism, unspecified: Secondary | ICD-10-CM

## 2021-10-25 MED ORDER — LEVOTHYROXINE SODIUM 25 MCG PO TABS: ORAL_TABLET | ORAL | 1 refills | Status: AC

## 2021-10-25 NOTE — ED Triage Notes (Signed)
Pt requested levothyroxine 25 mcg.

## 2021-10-25 NOTE — Discharge Instructions (Signed)
Take your thyroid medication as prescribed.  We will try to find you a new primary care provider for ongoing management.  You can return to our clinic if needed for blood work and refills.  If you have any symptoms please return for reevaluation.

## 2021-10-25 NOTE — ED Provider Notes (Signed)
[place]    CSN: [id number] Arrival date & time: 10/25/21  1156      History   Chief Complaint Chief Complaint  Patient presents with   Medication Refill    HPI Allison [name] is a 50 y.o. female.   Patient presents today accompanied by her husband who help provide the majority of history.  She has been any speaking and declined video interpreter and instead preferred to use her husband.  She is requesting a refill of her levothyroxine.  She is currently on 25 mcg daily and has been doing well on this medication for over a decade.  She denies history of hyperthyroidism or previous thyroidectomy.  Last TSH level obtained 10/28/2020 was appropriate at 2.790.  She denies any symptoms including unintentional weight changes, heat/cold intolerance, bowel changes, skin changes, mood changes.  She has no additional complaints or concerns.   Past Medical History:  Diagnosis Date   Anemia    Thyroid disease     Patient Active Problem List   Diagnosis Date Noted   Anemia 03/05/2015    History reviewed. No pertinent surgical history.  OB History     Gravida  2   Para  2   Term      Preterm      AB      Living         SAB      IAB      Ectopic      Multiple      Live Births               Home Medications    Prior to Admission medications   Medication Sig Start Date End Date Taking? Authorizing Provider  levonorgestrel (MIRENA) 20 MCG/24HR IUD 1 each by Intrauterine route once.    [provider]  levothyroxine (SYNTHROID) 25 MCG tablet Take one tablet by mouth every day before breakfast on an empty stomach 30-60 minutes before eating or taking other meds. 10/25/21   Denaja Verhoeven, Derry Skill, PA-C    Family History Family History  Problem Relation Age of Onset   High blood pressure Sister     Social History Social History   Tobacco Use   Smoking status: Never   Smokeless tobacco: Never  Substance Use Topics   Alcohol use: No   Drug use:  No     Allergies   Patient has no known allergies.   Review of Systems Review of Systems  Constitutional:  Negative for activity change, appetite change, fatigue, fever and unexpected weight change.  Respiratory:  Negative for cough and shortness of breath.   Cardiovascular:  Negative for chest pain.  Gastrointestinal:  Negative for abdominal pain, diarrhea, nausea and vomiting.  Neurological:  Negative for dizziness, light-headedness and headaches.    Physical Exam Triage Vital Signs ED Triage Vitals  Enc Vitals Group     BP 10/25/21 1332 128/89     Pulse --      Resp 10/25/21 1332 18     Temp 10/25/21 1332 98.1 F (36.7 C)     Temp Source 10/25/21 1332 Oral     SpO2 10/25/21 1332 (!) 63 %     Weight --      Height --      Head Circumference --      Peak Flow --      Pain Score 10/25/21 1330 0     Pain Loc --      Pain Edu? --  Excl. in GC? --    No data found.  Updated Vital Signs BP 128/89 (BP Location: Right Arm)   Temp 98.1 F (36.7 C) (Oral)   Resp 18   SpO2 98%   Visual Acuity Right Eye Distance:   Left Eye Distance:   Bilateral Distance:    Right Eye Near:   Left Eye Near:    Bilateral Near:     Physical Exam Vitals reviewed.  Constitutional:      General: She is awake. She is not in acute distress.    Appearance: Normal appearance. She is well-developed. She is not ill-appearing.     Comments: Very pleasant female appears stated age in no acute distress sitting comfortably on exam room table  HENT:     Head: Normocephalic and atraumatic.  Cardiovascular:     Rate and Rhythm: Normal rate and regular rhythm.     Heart sounds: Normal heart sounds, S1 normal and S2 normal. No murmur heard. Pulmonary:     Effort: Pulmonary effort is normal.     Breath sounds: Normal breath sounds. No wheezing, rhonchi or rales.     Comments: Clear to auscultation bilaterally Skin:    General: Skin is warm.  Psychiatric:        Behavior: Behavior is  cooperative.     UC Treatments / Results  Labs (all labs ordered are listed, but only abnormal results are displayed) Labs Reviewed - No data to display  EKG   Radiology No results found.  Procedures Procedures (including critical care time)  Medications Ordered in UC Medications - No data to display  Initial Impression / Assessment and Plan / UC Course  I have reviewed the triage vital signs and the nursing notes.  Pertinent labs & imaging results that were available during my care of the patient were reviewed by me and considered in my medical decision making (see chart for details).     Discussed potential utility of repeat TSH to ensure adequate dosing.  Patient declined to do this today and will have this done with her PCP.  She does not currently have a PCP so we will try to establish her with 1 via PCP assistance.  Refill of levothyroxine was sent to pharmacy.  Discussed that if she requires any additional refills she would need to return at which point we will draw TSH.  Recommended she take medication on empty stomach and wait 30 to 60 minutes to take medication or eat.  She return precautions given to which she expressed understanding.  Final Clinical Impressions(s) / UC Diagnoses   Final diagnoses:  Hypothyroidism, unspecified type  Medication refill     Discharge Instructions      Take your thyroid medication as prescribed.  We will try to find you a new primary care provider for ongoing management.  You can return to our clinic if needed for blood work and refills.  If you have any symptoms please return for reevaluation.     ED Prescriptions     Medication Sig Dispense Auth. Provider   levothyroxine (SYNTHROID) 25 MCG tablet Take one tablet by mouth every day before breakfast on an empty stomach 30-60 minutes before eating or taking other meds. 30 tablet Umberto Pavek, Noberto Retort, PA-C      PDMP not reviewed this encounter.   Jeani Hawking, PA-C 10/25/21  1356

## 2021-10-28 ENCOUNTER — Other Ambulatory Visit: Payer: Self-pay | Admitting: Obstetrics and Gynecology

## 2021-10-28 DIAGNOSIS — N644 Mastodynia: Secondary | ICD-10-CM

## 2021-12-17 ENCOUNTER — Other Ambulatory Visit: Payer: BC Managed Care – PPO

## 2021-12-18 ENCOUNTER — Other Ambulatory Visit: Payer: BC Managed Care – PPO

## 2022-01-13 DIAGNOSIS — Z0001 Encounter for general adult medical examination with abnormal findings: Secondary | ICD-10-CM | POA: Diagnosis not present

## 2022-01-13 DIAGNOSIS — Z131 Encounter for screening for diabetes mellitus: Secondary | ICD-10-CM | POA: Diagnosis not present

## 2022-01-13 DIAGNOSIS — Z136 Encounter for screening for cardiovascular disorders: Secondary | ICD-10-CM | POA: Diagnosis not present

## 2022-01-13 DIAGNOSIS — E039 Hypothyroidism, unspecified: Secondary | ICD-10-CM | POA: Diagnosis not present

## 2022-02-03 DIAGNOSIS — E782 Mixed hyperlipidemia: Secondary | ICD-10-CM | POA: Diagnosis not present

## 2022-02-03 DIAGNOSIS — N3000 Acute cystitis without hematuria: Secondary | ICD-10-CM | POA: Diagnosis not present

## 2022-02-03 DIAGNOSIS — E039 Hypothyroidism, unspecified: Secondary | ICD-10-CM | POA: Diagnosis not present

## 2022-02-06 ENCOUNTER — Other Ambulatory Visit: Payer: Self-pay

## 2022-02-06 ENCOUNTER — Ambulatory Visit
Admission: RE | Admit: 2022-02-06 | Discharge: 2022-02-06 | Disposition: A | Discharge status: Home / Self Care | Payer: BC Managed Care – PPO | Source: Ambulatory Visit | Attending: Obstetrics and Gynecology | Admitting: Obstetrics and Gynecology

## 2022-02-06 ENCOUNTER — Ambulatory Visit: Payer: BC Managed Care – PPO

## 2022-02-06 DIAGNOSIS — N644 Mastodynia: Secondary | ICD-10-CM

## 2022-02-06 DIAGNOSIS — R922 Inconclusive mammogram: Secondary | ICD-10-CM | POA: Diagnosis not present

## 2022-02-06 DIAGNOSIS — R928 Other abnormal and inconclusive findings on diagnostic imaging of breast: Secondary | ICD-10-CM | POA: Diagnosis not present

## 2022-02-06 DIAGNOSIS — N6489 Other specified disorders of breast: Secondary | ICD-10-CM | POA: Diagnosis not present

## 2022-02-24 DIAGNOSIS — E039 Hypothyroidism, unspecified: Secondary | ICD-10-CM | POA: Diagnosis not present

## 2022-02-24 DIAGNOSIS — E782 Mixed hyperlipidemia: Secondary | ICD-10-CM | POA: Diagnosis not present

## 2022-06-11 DIAGNOSIS — E782 Mixed hyperlipidemia: Secondary | ICD-10-CM | POA: Diagnosis not present

## 2022-06-11 DIAGNOSIS — E039 Hypothyroidism, unspecified: Secondary | ICD-10-CM | POA: Diagnosis not present

## 2022-06-25 DIAGNOSIS — E039 Hypothyroidism, unspecified: Secondary | ICD-10-CM | POA: Diagnosis not present

## 2022-06-25 DIAGNOSIS — E782 Mixed hyperlipidemia: Secondary | ICD-10-CM | POA: Diagnosis not present

## 2022-10-22 DIAGNOSIS — Z23 Encounter for immunization: Secondary | ICD-10-CM | POA: Diagnosis not present

## 2022-10-22 DIAGNOSIS — E039 Hypothyroidism, unspecified: Secondary | ICD-10-CM | POA: Diagnosis not present

## 2022-10-22 DIAGNOSIS — E782 Mixed hyperlipidemia: Secondary | ICD-10-CM | POA: Diagnosis not present

## 2022-11-05 ENCOUNTER — Other Ambulatory Visit: Payer: Self-pay | Admitting: Obstetrics and Gynecology

## 2022-11-05 DIAGNOSIS — Z124 Encounter for screening for malignant neoplasm of cervix: Secondary | ICD-10-CM | POA: Diagnosis not present

## 2022-11-05 DIAGNOSIS — N841 Polyp of cervix uteri: Secondary | ICD-10-CM | POA: Diagnosis not present

## 2022-11-05 DIAGNOSIS — Z01419 Encounter for gynecological examination (general) (routine) without abnormal findings: Secondary | ICD-10-CM | POA: Diagnosis not present

## 2022-11-05 DIAGNOSIS — Z1231 Encounter for screening mammogram for malignant neoplasm of breast: Secondary | ICD-10-CM | POA: Diagnosis not present

## 2022-11-20 DIAGNOSIS — E039 Hypothyroidism, unspecified: Secondary | ICD-10-CM | POA: Diagnosis not present

## 2022-11-20 DIAGNOSIS — E782 Mixed hyperlipidemia: Secondary | ICD-10-CM | POA: Diagnosis not present

## 2022-11-24 DIAGNOSIS — E782 Mixed hyperlipidemia: Secondary | ICD-10-CM | POA: Diagnosis not present

## 2022-11-24 DIAGNOSIS — E039 Hypothyroidism, unspecified: Secondary | ICD-10-CM | POA: Diagnosis not present

## 2023-05-29 IMAGING — MG MM DIGITAL DIAGNOSTIC UNILAT*R* W/ TOMO W/ CAD
6 series · 6 of 18 positions shown · non-contrast
Comparison: Previous exam(s).

CLINICAL DATA: Screening recall for a right breast asymmetry.

EXAM:
DIGITAL DIAGNOSTIC UNILATERAL RIGHT MAMMOGRAM WITH TOMOSYNTHESIS AND
CAD
TECHNIQUE: Right digital diagnostic mammography and breast tomosynthesis was
performed. The images were evaluated with computer-aided detection.

[R ML synth-2D]
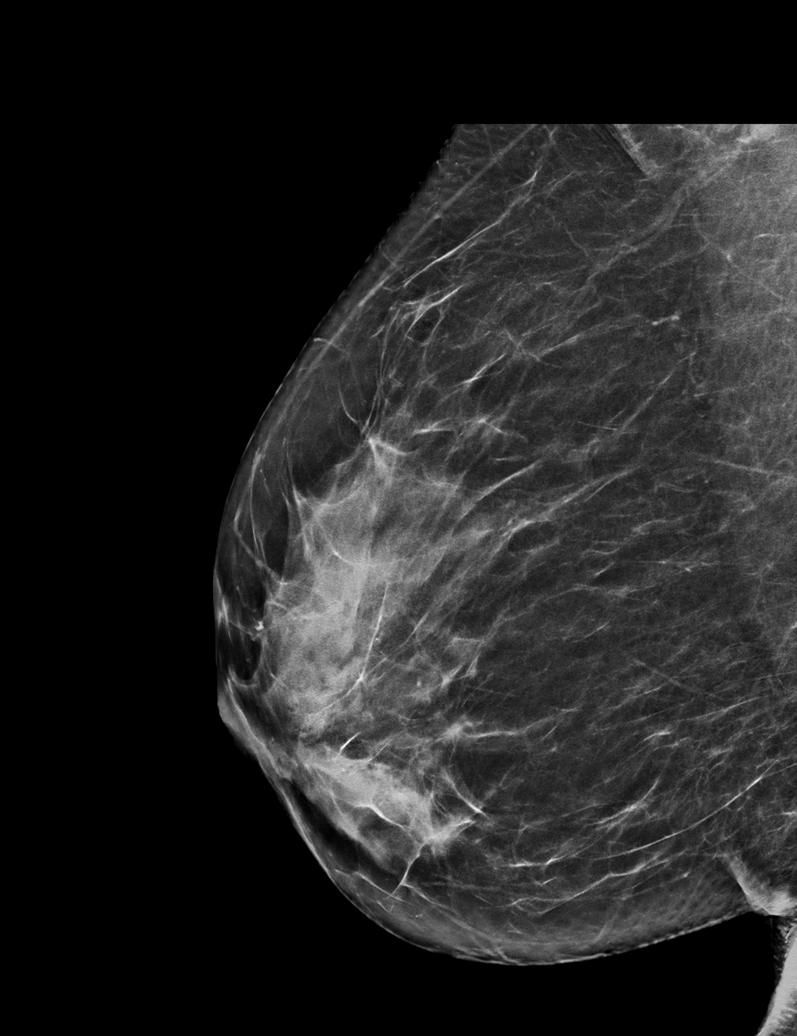

[R CC synth-2D (1 of 2)]
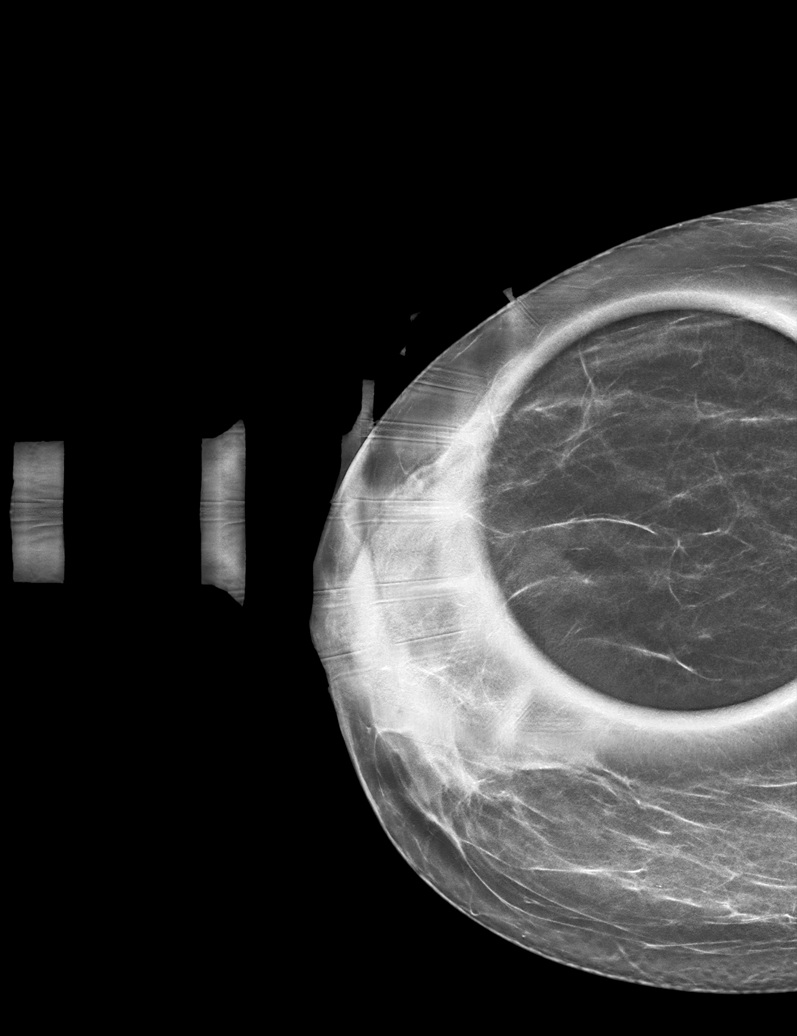

[R CC synth-2D (2 of 2)]
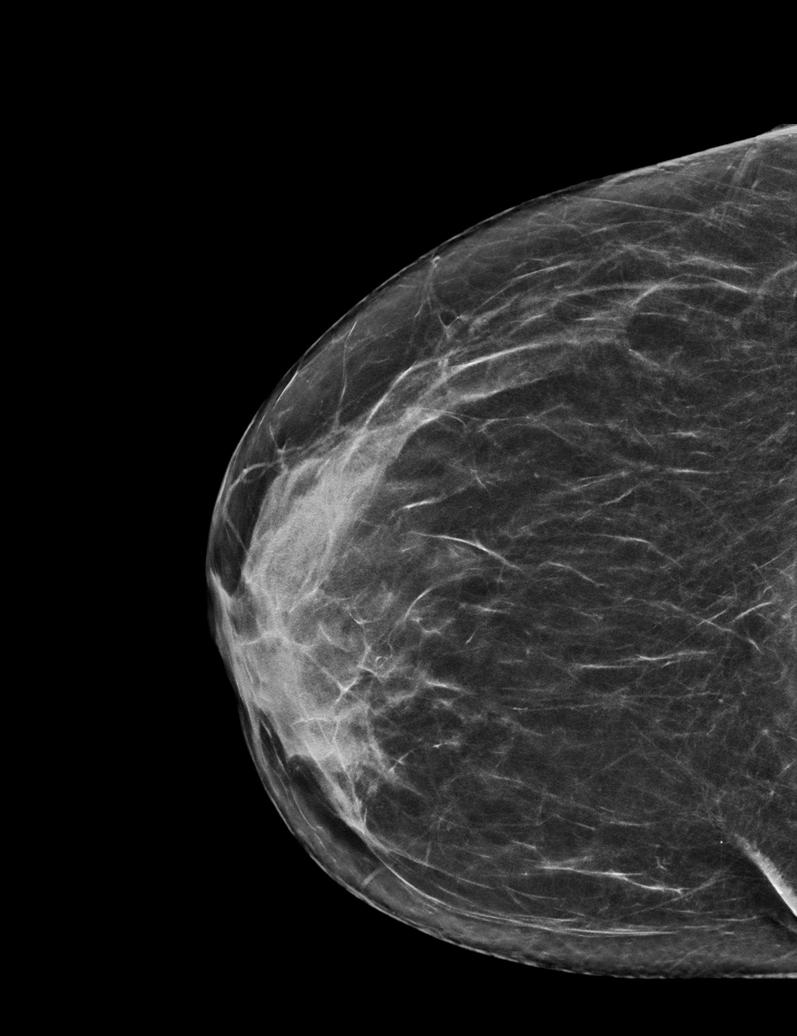

[R CC tomo (1 of 2) · tomo slice 27/52.0]
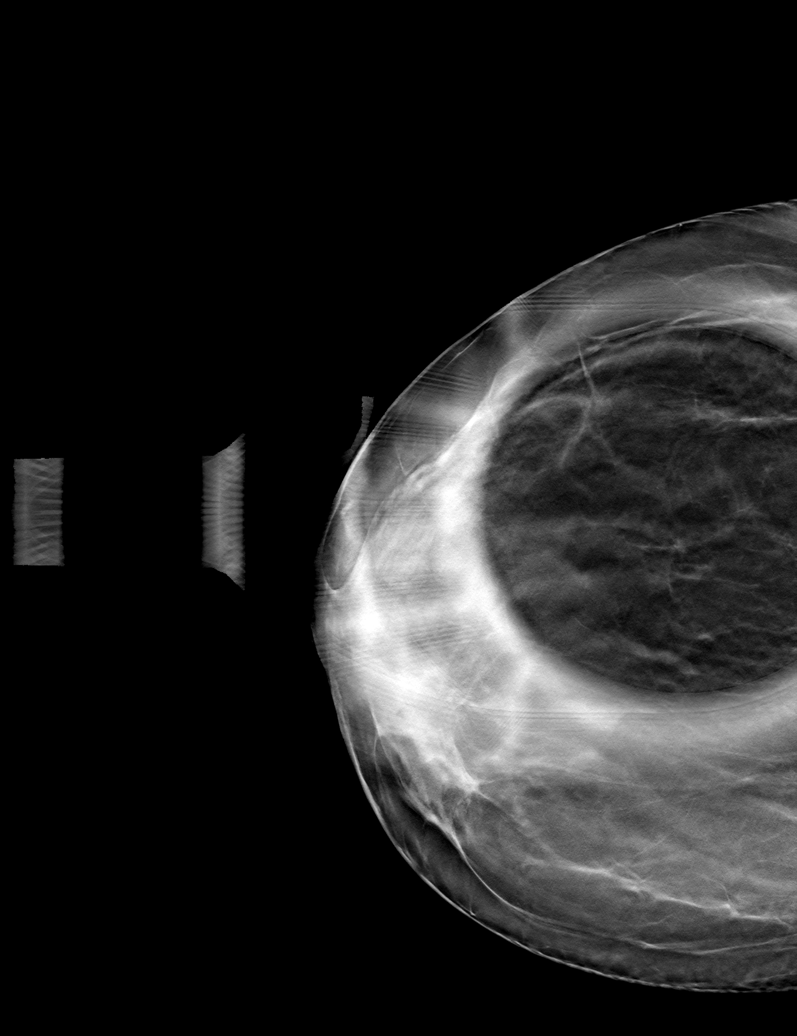

[R ML tomo · tomo slice 29/58.0]
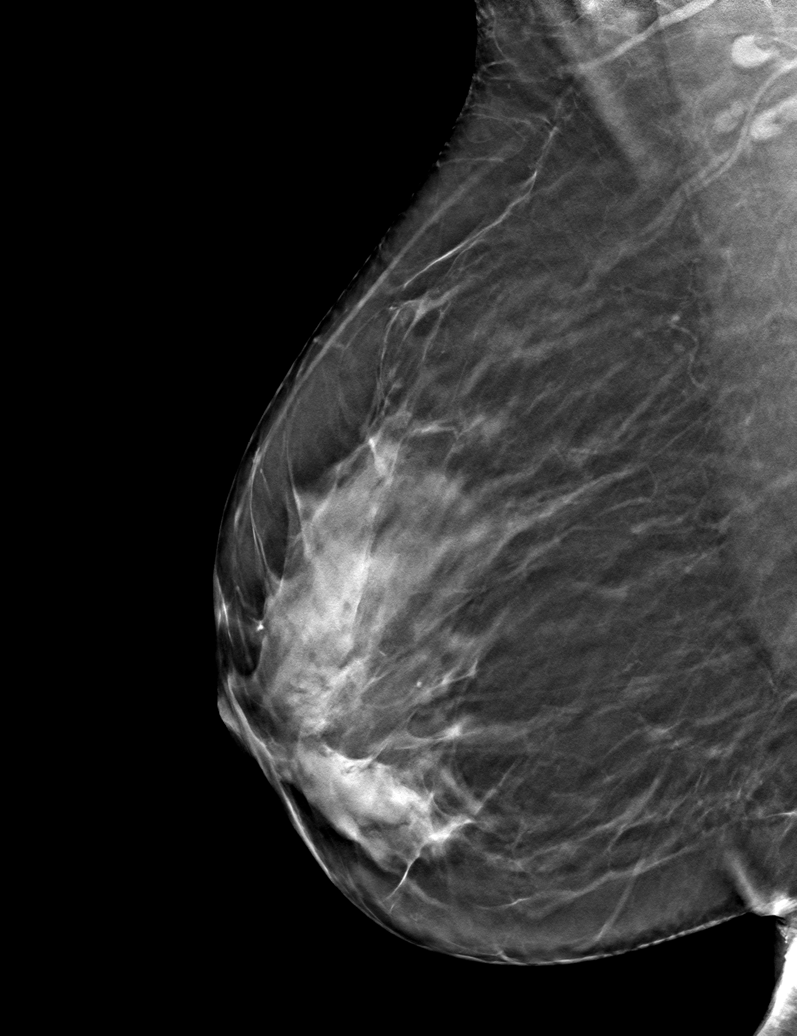

[R CC tomo (2 of 2) · tomo slice 29/57.0]
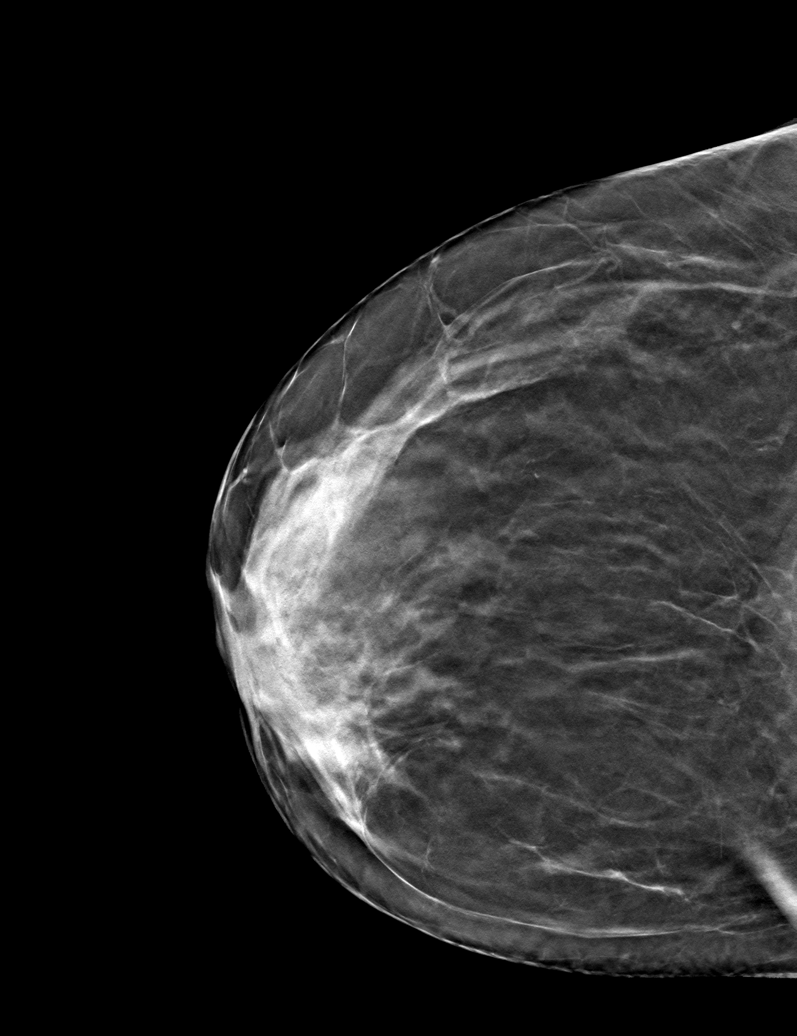

[6 of 18 positions shown; findings below may reference images not displayed]

ACR Breast Density Category c: The breast tissue is heterogeneously
dense, which may obscure small masses.
FINDINGS: The asymmetry previously seen in the upper-outer right breast
resolves with spot compression tomosynthesis imaging. A repeat full
paddle CC image was performed, demonstrating resolution of the
asymmetry of concern.
IMPRESSION: Resolution of the right breast asymmetry consistent with overlapping
fibroglandular tissue.

RECOMMENDATION:
Return to routine screening mammography is recommended. The patient
will be due for screening in Tuesday September, 2022.

I have discussed the findings and recommendations with the patient.
If applicable, a reminder letter will be sent to the patient
regarding the next appointment.

BI-RADS CATEGORY  1: Negative.
